# Patient Record
Sex: Male | Born: 1965 | Hispanic: Yes | Marital: Single | Smoking: Former smoker
Health system: Southern US, Community
[De-identification: ages and names within clinical notes are randomized; demographics above are authoritative.]

## PROBLEM LIST (undated history)

## (undated) DIAGNOSIS — E785 Hyperlipidemia, unspecified: Secondary | ICD-10-CM

## (undated) HISTORY — DX: Hyperlipidemia, unspecified: E78.5

---

## 2015-08-30 ENCOUNTER — Ambulatory Visit: Payer: Self-pay | Admitting: Physician Assistant

## 2015-08-31 ENCOUNTER — Other Ambulatory Visit: Payer: Self-pay | Admitting: Physician Assistant

## 2015-08-31 LAB — LIPID PANEL
CHOLESTEROL: 152 mg/dL (ref 125–200)
HDL: 45 mg/dL (ref >=40)
LDL CALC: 92 mg/dL (ref <130)
Total CHOL/HDL Ratio: 3.4 Ratio (ref <=5.0)
Triglycerides: 76 mg/dL (ref <150)
VLDL: 15 mg/dL (ref <30)

## 2015-08-31 LAB — COMPREHENSIVE METABOLIC PANEL
ALBUMIN: 4.2 g/dL (ref 3.6–5.1)
ALT: 38 U/L (ref 9–46)
AST: 26 U/L (ref 10–40)
Alkaline Phosphatase: 77 U/L (ref 40–115)
BILIRUBIN TOTAL: 0.7 mg/dL (ref 0.2–1.2)
BUN: 16 mg/dL (ref 7–25)
CHLORIDE: 108 mmol/L (ref 98–110)
CO2: 23 mmol/L (ref 20–31)
CREATININE: 0.84 mg/dL (ref 0.60–1.35)
Calcium: 9.3 mg/dL (ref 8.6–10.3)
Glucose, Bld: 100 mg/dL — ABNORMAL HIGH (ref 65–99)
Potassium: 5 mmol/L (ref 3.5–5.3)
SODIUM: 140 mmol/L (ref 135–146)
TOTAL PROTEIN: 6.6 g/dL (ref 6.1–8.1)

## 2015-09-05 ENCOUNTER — Ambulatory Visit: Payer: Self-pay | Admitting: Physician Assistant

## 2015-09-05 ENCOUNTER — Encounter: Payer: Self-pay | Admitting: Physician Assistant

## 2015-09-05 VITALS — BP 118/74 | HR 59 | Temp 97.7°F | Ht 61.5 in | Wt 143.4 lb

## 2015-09-05 DIAGNOSIS — E785 Hyperlipidemia, unspecified: Secondary | ICD-10-CM

## 2015-09-05 NOTE — Progress Notes (Signed)
BP 118/74 mmHg  Pulse 59  Temp(Src) 97.7 F (36.5 C)  Ht 5' 1.5" (1.562 m)  Wt 143 lb 6.4 oz (65.046 kg)  BMI 26.66 kg/m2  SpO2 98%   Subjective:    Patient ID: Eric Warner, male    DOB: Dec 18, 1965, 50 y.o.   MRN: [id number]  HPI: Eric Warner is a 49 y.o. male presenting on 09/05/2015 for Hyperlipidemia   HPI Pt is no longer taking his lovastatin.  He just stopped taking it because he thought he would be okay without it.  He discontinued it about 2 months ago.  He has been eating low-fat diet.  Relevant past medical, surgical, family and social history reviewed and updated as indicated. Interim medical history since our last visit reviewed. Allergies and medications reviewed and updated.  CURRENT MEDS: none  Review of Systems  Constitutional: Negative for fever, chills, diaphoresis, appetite change, fatigue and unexpected weight change.  HENT: Negative for congestion, dental problem, drooling, ear pain, facial swelling, hearing loss, mouth sores, sneezing, sore throat, trouble swallowing and voice change.   Eyes: Negative for pain, discharge, redness, itching and visual disturbance.  Respiratory: Negative for cough, choking, shortness of breath and wheezing.   Cardiovascular: Negative for chest pain, palpitations and leg swelling.  Gastrointestinal: Negative for vomiting, abdominal pain, diarrhea, constipation and blood in stool.  Endocrine: Negative for cold intolerance, heat intolerance and polydipsia.  Genitourinary: Negative for dysuria, hematuria and decreased urine volume.  Musculoskeletal: Negative for back pain, arthralgias and gait problem.  Skin: Negative for rash.  Allergic/Immunologic: Negative for environmental allergies.  Neurological: Negative for seizures, syncope, light-headedness and headaches.  Hematological: Negative for adenopathy.  Psychiatric/Behavioral: Negative for suicidal ideas, dysphoric mood and agitation. The patient is not  nervous/anxious.     Per HPI unless specifically indicated above     Objective:    BP 118/74 mmHg  Pulse 59  Temp(Src) 97.7 F (36.5 C)  Ht 5' 1.5" (1.562 m)  Wt 143 lb 6.4 oz (65.046 kg)  BMI 26.66 kg/m2  SpO2 98%  Wt Readings from Last 3 Encounters:  09/05/15 143 lb 6.4 oz (65.046 kg)    Physical Exam  Constitutional: He is oriented to person, place, and time. He appears well-developed and well-nourished.  HENT:  Head: Normocephalic and atraumatic.  Neck: Neck supple.  Cardiovascular: Normal rate and regular rhythm.   Pulmonary/Chest: Effort normal and breath sounds normal. He has no wheezes.  Abdominal: Soft. Bowel sounds are normal. There is no tenderness.  Musculoskeletal: He exhibits no edema.  Lymphadenopathy:    He has no cervical adenopathy.  Neurological: He is alert and oriented to person, place, and time.  Skin: Skin is warm and dry.  Psychiatric: He has a normal mood and affect. His behavior is normal.  Vitals reviewed.   Results for orders placed or performed in visit on 08/31/15  Comprehensive metabolic panel  Result Value Ref Range   Sodium 140 135 - 146 mmol/L   Potassium 5.0 3.5 - 5.3 mmol/L   Chloride 108 98 - 110 mmol/L   CO2 23 20 - 31 mmol/L   Glucose, Bld 100 (H) 65 - 99 mg/dL   BUN 16 7 - 25 mg/dL   Creat 4.09 8.11 - 9.14 mg/dL   Total Bilirubin 0.7 0.2 - 1.2 mg/dL   Alkaline Phosphatase 77 40 - 115 U/L   AST 26 10 - 40 U/L   ALT 38 9 - 46 U/L   Total Protein 6.6 6.1 -  8.1 g/dL   Albumin 4.2 3.6 - 5.1 g/dL   Calcium 9.3 8.6 - 16.110.3 mg/dL  Lipid panel  Result Value Ref Range   Cholesterol 152 125 - 200 mg/dL   Triglycerides 76 <096<150 mg/dL   HDL 45 >=04>=40 mg/dL   Total CHOL/HDL Ratio 3.4 <=5.0 Ratio   VLDL 15 <30 mg/dL   LDL Cholesterol 92 <540<130 mg/dL      Assessment & Plan:   Encounter Diagnosis  Name Primary?  . Hyperlipidemia Yes   Pt will continue to eat lowfat diet to maintain his cholesterol at a healthy level.  F/u 1 year.   RTO sooner prn

## 2016-05-02 ENCOUNTER — Ambulatory Visit: Payer: Self-pay | Admitting: Physician Assistant

## 2016-05-02 ENCOUNTER — Encounter: Payer: Self-pay | Admitting: Physician Assistant

## 2016-05-02 VITALS — BP 118/84 | HR 64 | Temp 97.7°F | Ht 61.5 in | Wt 144.5 lb

## 2016-05-02 DIAGNOSIS — K649 Unspecified hemorrhoids: Secondary | ICD-10-CM

## 2016-05-02 MED ORDER — HYDROCORTISONE ACETATE 25 MG RE SUPP: RECTAL | 3 refills | Status: DC

## 2016-05-02 NOTE — Patient Instructions (Signed)
Hemorroides   (Hemorrhoids)  Las hemorroides son venas inflamadas alrededor del recto o del ano. Hay dos tipos de hemorroides:   · Hemorroides internas. Se forman en las venas del interior del recto. Pueden abultarse hacia el exterior e irritarse y doler.  · Hemorroides externas. Se producen en las venas externas al ano y pueden sentirse como un bulto o zona hinchada dura y dolorosa cerca del ano.  CAUSAS   · Embarazo.    · Obesidad.    · Constipación o diarrea.    · Dificultad para mover el intestino.    · Permanecer sentado durante largos períodos en el inodoro.  · Levantar objetos pesados u otras actividades que impliquen esfuerzo.  · Sexo anal.  SÍNTOMAS   · Dolor.    · Picazón o irritación anal.    · Sangrado rectal.    · Pérdida fecal.    · Inflamación anal.    · Uno o más bultos en la zona anal.    DIAGNÓSTICO   El médico puede diagnosticar las hemorroides mediante un examen visual. Otros estudios o análisis que se pueden realizar son:   · Examen de la zona rectal con una mano enguantada (examen digital rectal).    · Examen del canal anal utilizando un pequeño tubo (endoscopio).    · Análisis de sangre si ha perdido una cantidad significativa de sangre.  · Un estudio para observar el interior del colon (sigmoideoscopía o colonoscopía).  TRATAMIENTO   La mayoría de las hemorroides pueden tratarse en casa. Sin embargo, si los síntomas no mejoran o tiene sangrado rectal, el médico puede realizar un procedimiento para disminuir las hemorroides o extirparlas completamente. Los tratamientos posibles son:   · Colocación de una banda de goma en la base de la hemorroide para cortar la circulación (ligadura con banda elástica).    · Inyección de una sustancia química para achicar las hemorroides (escleroterapia).    · Utilización de un instrumento para quemar las hemorroides (terapia con luz infrarroja).    · Extirpación quirúrgica de la hemorroides (hemorroidectomía).    · Colocación de grapas en la hemorroides para  bloquear el flujo de sangre a los tejidos (engrapado de hemorroides).    INSTRUCCIONES PARA EL CUIDADO EN EL HOGAR   · Consuma alimentos con fibra, como cereales integrales, legumbres, frutos secos, frutas y verduras. Pregúntele a su médico acerca de tomar productos con fibra añadida en ellos (suplementos de fibra).  · Aumente la ingestión de líquidos. Beba gran cantidad de líquido para mantener la orina de tono claro o color amarillo pálido.    · Haga ejercicios regularmente.    · Vaya al baño cuando sienta la necesidad de mover el intestino. No espere.    · Evite hacer fuerza al mover el intestino.    · Mantenga la zona anal limpia y seca. Use papel higiénico húmedo o toallitas humedecidas después de mover el intestino.    · Puede usar o aplicar según las indicaciones algunas cremas especiales y supositorios.    · Tome sólo medicamentos de venta libre o recetados, según las indicaciones del médico.    · Tome baños de asiento tibios durante 15-20 minutos, 3-4 veces por día para calmar el dolor y las molestias.    · Coloque una bolsa de hielo sobre las hemorroides si le duelen o se hinchan. Usar compresas de hielo entre los baños de asiento puede ser efectivo.    ¨ Ponga el hielo en una bolsa plástica.    ¨ Colóquese una toalla entre la piel y la bolsa de hielo.    ¨ Deje el hielo durante 15 - 20 minutos y aplíquelo 3 - 4   veces por día.    · No utilice una almohada en forma de aro ni se siente en el inodoro durante períodos prolongados. Esto aumenta la afluencia de sangre y el dolor.    SOLICITE ATENCIÓN MÉDICA SI:   · Aumenta el dolor y la hinchazón y no puede controlarlo con la medicación o con un tratamiento.  · Tiene un sangrado que no puede controlar.  · No puede mover el intestino.  · Siente dolor o tiene inflamación fuera de la zona de las hemorroides.  ASEGÚRESE DE QUE:   · Comprende estas instrucciones.  · Controlará su enfermedad.  · Recibirá ayuda de inmediato si no mejora o si empeora.     Esta  información no tiene como fin reemplazar el consejo del médico. Asegúrese de hacerle al médico cualquier pregunta que tenga.     Document Released: 08/26/2005 Document Revised: 04/28/2013  Elsevier Interactive Patient Education ©2016 [employer]

## 2016-05-02 NOTE — Progress Notes (Signed)
   BP 118/84 (BP Location: Left Arm, Patient Position: Sitting, Cuff Size: Normal)   Pulse 64   Temp 97.7 F (36.5 C)   Ht 5' 1.5" (1.562 m)   Wt 144 lb 8 oz (65.5 kg)   SpO2 98%   BMI 26.86 kg/m    Subjective:    Patient ID: Eric Warner, male    DOB: 1966-09-07, 50 y.o.   MRN: [id number]  HPI: Eric Warner is a 50 y.o. male presenting on 05/02/2016 for Rectal Pain (pain began 2 weeks ago and went to urgent care and was given hydrocortisione cream 2.5% pt states it was helpful. pt states on Monday 04-29-16 pain worsen. pt states no blood in stool. )   HPI  Relevant past medical, surgical, family and social history reviewed and updated as indicated. Interim medical history since our last visit reviewed. Allergies and medications reviewed and updated.  CURRENT MEDS: none  Review of Systems  Constitutional: Negative for appetite change, chills, diaphoresis, fatigue, fever and unexpected weight change.  HENT: Negative for congestion, drooling, ear pain, facial swelling, hearing loss, mouth sores, sneezing, sore throat, trouble swallowing and voice change.   Eyes: Negative for pain, discharge, redness, itching and visual disturbance.  Respiratory: Negative for cough, choking, shortness of breath and wheezing.   Cardiovascular: Negative for chest pain, palpitations and leg swelling.  Gastrointestinal: Negative for abdominal pain, blood in stool, constipation, diarrhea and vomiting.  Endocrine: Negative for cold intolerance, heat intolerance and polydipsia.  Genitourinary: Negative for decreased urine volume, dysuria and hematuria.  Musculoskeletal: Negative for arthralgias, back pain and gait problem.  Skin: Negative for rash.  Allergic/Immunologic: Negative for environmental allergies.  Neurological: Negative for seizures, syncope, light-headedness and headaches.  Hematological: Negative for adenopathy.  Psychiatric/Behavioral: Negative for agitation, dysphoric mood  and suicidal ideas. The patient is not nervous/anxious.     Per HPI unless specifically indicated above     Objective:    BP 118/84 (BP Location: Left Arm, Patient Position: Sitting, Cuff Size: Normal)   Pulse 64   Temp 97.7 F (36.5 C)   Ht 5' 1.5" (1.562 m)   Wt 144 lb 8 oz (65.5 kg)   SpO2 98%   BMI 26.86 kg/m   Wt Readings from Last 3 Encounters:  05/02/16 144 lb 8 oz (65.5 kg)  09/05/15 143 lb 6.4 oz (65 kg)    Physical Exam  Constitutional: He is oriented to person, place, and time. He appears well-developed and well-nourished.  HENT:  Head: Normocephalic and atraumatic.  Pulmonary/Chest: Effort normal.  Abdominal: Soft. He exhibits no distension and no mass. There is no tenderness. There is no rebound and no guarding.  Genitourinary: Rectal exam shows external hemorrhoid (very mild, nonthrombosed).  Genitourinary Comments: (nurse Berenice assisted)  Neurological: He is alert and oriented to person, place, and time.  Skin: Skin is warm and dry.  Psychiatric: He has a normal mood and affect. His behavior is normal.  Nursing note and vitals reviewed.       Assessment & Plan:   Encounter Diagnosis  Name Primary?  . Hemorrhoids, unspecified hemorrhoid type Yes    -rx anusol supp, fiber/metamucil, fluids -Gave reading information -f/u as scheduled.  RTO sooner prn

## 2016-08-06 ENCOUNTER — Ambulatory Visit: Payer: Self-pay | Admitting: Physician Assistant

## 2016-08-19 ENCOUNTER — Ambulatory Visit: Payer: Self-pay | Admitting: Physician Assistant

## 2016-08-19 ENCOUNTER — Encounter: Payer: Self-pay | Admitting: Physician Assistant

## 2016-08-19 VITALS — BP 120/62 | HR 61 | Temp 97.7°F | Ht 61.5 in | Wt 147.0 lb

## 2016-08-19 DIAGNOSIS — E785 Hyperlipidemia, unspecified: Secondary | ICD-10-CM

## 2016-08-19 DIAGNOSIS — M25512 Pain in left shoulder: Secondary | ICD-10-CM

## 2016-08-19 NOTE — Progress Notes (Signed)
BP 120/62 (BP Location: Left Arm, Patient Position: Sitting, Cuff Size: Normal)   Pulse 61   Temp 97.7 F (36.5 C)   Ht 5' 1.5" (1.562 m)   Wt 147 lb (66.7 kg)   SpO2 99%   BMI 27.33 kg/m    Subjective:    Patient ID: Eric Warner, male    DOB: 1965-12-08, 50 y.o.   MRN: [id number]  HPI: Eric Warner is a 50 y.o. male presenting on 08/19/2016 for Hyperlipidemia   HPI  Pt has not been eating lowfat diet.  Pt states L shoulder pain at night when he is laying on it.  Pt works [employer].  He says he isn't having any problems with work but it hurts at night when he lays down on it.  L shoulder. pain. for about 1.5 months. pt has used icy hot and it is a little helpful sometimes.   Relevant past medical, surgical, family and social history reviewed and updated as indicated. Interim medical history since our last visit reviewed. Allergies and medications reviewed and updated.  No current outpatient prescriptions on file.   Review of Systems  Constitutional: Negative for appetite change, chills, diaphoresis, fatigue, fever and unexpected weight change.  HENT: Negative for congestion, dental problem, drooling, ear pain, facial swelling, hearing loss, mouth sores, sneezing, sore throat, trouble swallowing and voice change.   Eyes: Negative for pain, discharge, redness, itching and visual disturbance.  Respiratory: Negative for cough, choking, shortness of breath and wheezing.   Cardiovascular: Negative for chest pain, palpitations and leg swelling.  Gastrointestinal: Negative for abdominal pain, blood in stool, constipation, diarrhea and vomiting.  Endocrine: Negative for cold intolerance, heat intolerance and polydipsia.  Genitourinary: Negative for decreased urine volume, dysuria and hematuria.  Musculoskeletal: Negative for arthralgias, back pain and gait problem.  Skin: Negative for rash.  Allergic/Immunologic: Negative for environmental allergies.   Neurological: Negative for seizures, syncope, light-headedness and headaches.  Hematological: Negative for adenopathy.  Psychiatric/Behavioral: Negative for agitation, dysphoric mood and suicidal ideas. The patient is not nervous/anxious.     Per HPI unless specifically indicated above     Objective:    BP 120/62 (BP Location: Left Arm, Patient Position: Sitting, Cuff Size: Normal)   Pulse 61   Temp 97.7 F (36.5 C)   Ht 5' 1.5" (1.562 m)   Wt 147 lb (66.7 kg)   SpO2 99%   BMI 27.33 kg/m   Wt Readings from Last 3 Encounters:  08/19/16 147 lb (66.7 kg)  05/02/16 144 lb 8 oz (65.5 kg)  09/05/15 143 lb 6.4 oz (65 kg)    Physical Exam  Constitutional: He is oriented to person, place, and time. He appears well-developed and well-nourished.  HENT:  Head: Normocephalic and atraumatic.  Neck: Neck supple.  Cardiovascular: Normal rate and regular rhythm.   Pulmonary/Chest: Effort normal and breath sounds normal. He has no wheezes.  Abdominal: Soft. Bowel sounds are normal. There is no hepatosplenomegaly. There is no tenderness.  Musculoskeletal: He exhibits no edema.       Left shoulder: Normal. He exhibits normal range of motion, no tenderness, no bony tenderness, no swelling, no deformity, no spasm and normal strength.  Lymphadenopathy:    He has no cervical adenopathy.  Neurological: He is alert and oriented to person, place, and time.  Skin: Skin is warm and dry.  Psychiatric: He has a normal mood and affect. His behavior is normal.  Vitals reviewed.       Assessment & Plan:  Encounter Diagnoses  Name Primary?  . Hyperlipidemia, unspecified hyperlipidemia type Yes  . Left shoulder pain, unspecified chronicity      -Check fasting lipids today. Will call with results -Counseled pt to get back to following lowfat diet -Recommended pt use ice on the shoulder for 10-15 minutes each day when he gets home from work.  He can use IBU or APAP prn.  Encouraged him to avoid  sleeping on his left side  -follow up one year.  RTO sooner prn

## 2016-08-20 LAB — LIPID PANEL
CHOL/HDL RATIO: 3.7 ratio (ref <5.0)
CHOLESTEROL: 196 mg/dL (ref <200)
HDL: 53 mg/dL (ref >40)
LDL Cholesterol: 108 mg/dL — ABNORMAL HIGH (ref <100)
Triglycerides: 176 mg/dL — ABNORMAL HIGH (ref <150)
VLDL: 35 mg/dL — ABNORMAL HIGH (ref <30)

## 2017-08-18 ENCOUNTER — Ambulatory Visit: Payer: Self-pay | Admitting: Physician Assistant

## 2017-08-19 ENCOUNTER — Encounter: Payer: Self-pay | Admitting: Physician Assistant

## 2017-08-19 ENCOUNTER — Ambulatory Visit: Payer: Self-pay | Admitting: Physician Assistant

## 2017-08-19 VITALS — BP 132/83 | HR 70 | Temp 97.5°F | Ht 61.5 in | Wt 154.5 lb

## 2017-08-19 DIAGNOSIS — E785 Hyperlipidemia, unspecified: Secondary | ICD-10-CM

## 2017-08-19 DIAGNOSIS — R030 Elevated blood-pressure reading, without diagnosis of hypertension: Secondary | ICD-10-CM

## 2017-08-19 NOTE — Progress Notes (Signed)
   BP 137/88 (BP Location: Left Arm, Patient Position: Sitting, Cuff Size: Normal)   Pulse 70   Temp (!) 97.5 F (36.4 C)   Ht 5' 1.5" (1.562 m)   Wt 154 lb 8 oz (70.1 kg)   SpO2 98%   BMI 28.72 kg/m    Subjective:    Patient ID: Eric Warner, male    DOB: May 11, 1966, 51 y.o.   MRN: [id number]  HPI: Eric Warner is a 51 y.o. male presenting on 08/19/2017 for Hyperlipidemia   HPI  Pt is eating low fat and exercising regularly.   He feels well  Relevant past medical, surgical, family and social history reviewed and updated as indicated. Interim medical history since our last visit reviewed. Allergies and medications reviewed and updated.  No current outpatient medications on file.   Review of Systems  Constitutional: Negative for appetite change, chills, diaphoresis, fatigue, fever and unexpected weight change.  HENT: Negative for congestion, dental problem, drooling, ear pain, facial swelling, hearing loss, mouth sores, sneezing, sore throat, trouble swallowing and voice change.   Eyes: Negative for pain, discharge, redness, itching and visual disturbance.  Respiratory: Negative for cough, choking, shortness of breath and wheezing.   Cardiovascular: Negative for chest pain, palpitations and leg swelling.  Gastrointestinal: Negative for abdominal pain, blood in stool, constipation, diarrhea and vomiting.  Endocrine: Negative for cold intolerance, heat intolerance and polydipsia.  Genitourinary: Negative for decreased urine volume, dysuria and hematuria.  Musculoskeletal: Negative for arthralgias, back pain and gait problem.  Skin: Negative for rash.  Allergic/Immunologic: Negative for environmental allergies.  Neurological: Negative for seizures, syncope, light-headedness and headaches.  Hematological: Negative for adenopathy.  Psychiatric/Behavioral: Negative for agitation, dysphoric mood and suicidal ideas. The patient is not nervous/anxious.     Per HPI  unless specifically indicated above     Objective:    BP 137/88 (BP Location: Left Arm, Patient Position: Sitting, Cuff Size: Normal)   Pulse 70   Temp (!) 97.5 F (36.4 C)   Ht 5' 1.5" (1.562 m)   Wt 154 lb 8 oz (70.1 kg)   SpO2 98%   BMI 28.72 kg/m   Wt Readings from Last 3 Encounters:  08/19/17 154 lb 8 oz (70.1 kg)  08/19/16 147 lb (66.7 kg)  05/02/16 144 lb 8 oz (65.5 kg)    Physical Exam  Constitutional: He is oriented to person, place, and time. He appears well-developed and well-nourished.  HENT:  Head: Normocephalic and atraumatic.  Neck: Neck supple.  Cardiovascular: Normal rate and regular rhythm.  Pulmonary/Chest: Effort normal and breath sounds normal. He has no wheezes.  Abdominal: Soft. Bowel sounds are normal. There is no hepatosplenomegaly. There is no tenderness.  Musculoskeletal: He exhibits no edema.  Lymphadenopathy:    He has no cervical adenopathy.  Neurological: He is alert and oriented to person, place, and time.  Skin: Skin is warm and dry.  Psychiatric: He has a normal mood and affect. His behavior is normal.  Vitals reviewed.        Assessment & Plan:    Encounter Diagnoses  Name Primary?  . Hyperlipidemia, unspecified hyperlipidemia type Yes  . Elevated blood pressure reading in office without diagnosis of hypertension      -Check fasting labs.  Will call pt with results -pt to  Continue lowfat diet and regular exercise -pt to Follow up 6 month to recheck mildly elevated blood pressure.  RTO sooner prn

## 2017-08-20 ENCOUNTER — Ambulatory Visit: Payer: Self-pay | Admitting: Physician Assistant

## 2017-08-28 DIAGNOSIS — Z139 Encounter for screening, unspecified: Secondary | ICD-10-CM

## 2017-08-28 NOTE — Congregational Nurse Program (Signed)
Congregational Nurse Program Note  Date of Encounter: 08/28/2017  Past Medical History: Past Medical History:  Diagnosis Date  . Hyperlipidemia     Encounter Details: CNP Questionnaire - 08/28/17 1520      Questionnaire   Patient Status  Immigrant    Race  Hispanic or Latino    [facility]    Insurance  Not Applicable    Uninsured  Uninsured (NEW 1x/quarter)    Food  No food insecurities    Housing/Utilities  Yes, have permanent housing    Transportation  No transportation needs    Interpersonal Safety  Yes, feel physically and emotionally safe where you currently live    Medication  No medication insecurities    Medical Provider  Yes    Referrals  Not Applicable    ED Visit Averted  Not Applicable    Life-Saving Intervention Made  Not Applicable       51 y.o hispanic male pt [facility]. [facility]   Vitals: BP: 122/81 Pulse: 64 Temp: 98.2 oral Ht: 5'2 Wt: 152.0  Per pt: Family History:  Mother- diabetes    Pt was given her Flu vaccine on left deltoid.   Flu vaccine info:  Lot # [id number] NDC: [id number] MFG: (IIV) [employer] Fluarix 2018-19 Expires: 03/08/2018  Pt was asked to stay 15 minuetes after her flu vaccine was given. Pt was not expressing any adverse reaction, no allergic reaction was noted while the pt was in the office.Pt was advised to go to the nearest emergency room or call 911 if he had any SOB, rash, or swelling. Pt was also advised to take ibuprofen or tylenol if he can and needs it for the soreness. Also apply a cold compress if needed. Pt agreed.      Kainan Patty R. ClawsonRangel, [place]LP[place] [phone]

## 2018-02-09 ENCOUNTER — Ambulatory Visit: Payer: Self-pay | Admitting: Physician Assistant

## 2018-02-09 ENCOUNTER — Other Ambulatory Visit (HOSPITAL_COMMUNITY)
Admission: RE | Admit: 2018-02-09 | Discharge: 2018-02-09 | Disposition: A | Discharge status: Home / Self Care | Payer: Self-pay | Source: Ambulatory Visit | Attending: Physician Assistant | Admitting: Physician Assistant

## 2018-02-09 DIAGNOSIS — E785 Hyperlipidemia, unspecified: Secondary | ICD-10-CM | POA: Insufficient documentation

## 2018-02-09 DIAGNOSIS — R030 Elevated blood-pressure reading, without diagnosis of hypertension: Secondary | ICD-10-CM | POA: Insufficient documentation

## 2018-02-09 LAB — COMPREHENSIVE METABOLIC PANEL
ALK PHOS: 71 U/L (ref 38–126)
ALT: 32 U/L (ref 17–63)
AST: 24 U/L (ref 15–41)
Albumin: 4 g/dL (ref 3.5–5.0)
Anion gap: 4 — ABNORMAL LOW (ref 5–15)
BILIRUBIN TOTAL: 0.9 mg/dL (ref 0.3–1.2)
BUN: 13 mg/dL (ref 6–20)
CALCIUM: 9.3 mg/dL (ref 8.9–10.3)
CO2: 27 mmol/L (ref 22–32)
CREATININE: 0.82 mg/dL (ref 0.61–1.24)
Chloride: 109 mmol/L (ref 101–111)
Glucose, Bld: 114 mg/dL — ABNORMAL HIGH (ref 65–99)
Potassium: 4.6 mmol/L (ref 3.5–5.1)
Sodium: 140 mmol/L (ref 135–145)
TOTAL PROTEIN: 7.1 g/dL (ref 6.5–8.1)

## 2018-02-09 LAB — LIPID PANEL
Cholesterol: 231 mg/dL — ABNORMAL HIGH (ref 0–200)
HDL: 54 mg/dL (ref >40)
LDL CALC: 150 mg/dL — AB (ref 0–99)
Total CHOL/HDL Ratio: 4.3 RATIO
Triglycerides: 135 mg/dL (ref <150)
VLDL: 27 mg/dL (ref 0–40)

## 2018-02-11 ENCOUNTER — Encounter: Payer: Self-pay | Admitting: Physician Assistant

## 2018-02-11 ENCOUNTER — Ambulatory Visit: Payer: Self-pay | Admitting: Physician Assistant

## 2018-02-11 VITALS — BP 116/76 | HR 62 | Temp 97.7°F | Wt 143.5 lb

## 2018-02-11 DIAGNOSIS — Z125 Encounter for screening for malignant neoplasm of prostate: Secondary | ICD-10-CM

## 2018-02-11 DIAGNOSIS — L659 Nonscarring hair loss, unspecified: Secondary | ICD-10-CM

## 2018-02-11 DIAGNOSIS — E785 Hyperlipidemia, unspecified: Secondary | ICD-10-CM

## 2018-02-11 NOTE — Progress Notes (Signed)
BP 116/76 (BP Location: Left Arm, Patient Position: Sitting, Cuff Size: Normal)   Pulse 62   Temp 97.7 F (36.5 C)   Wt 143 lb 8 oz (65.1 kg)   SpO2 100%   BMI 26.25 kg/m    Subjective:    Patient ID: Eric Warner, male    DOB: 06/27/1966, 52 y.o.   MRN: [id number]  HPI: Eric Warner is a 52 y.o. male presenting on 02/11/2018 for Hair/Scalp Problem (pt has place on head where hair seems to not grown anymore.)   HPI  Chief Complaint  Patient presents with  . Hair/Scalp Problem    pt has place on head where hair seems to not grown anymore.     Pt says the bald spot has been an issue for about 3 month.   Relevant past medical, surgical, family and social history reviewed and updated as indicated. Interim medical history since our last visit reviewed. Allergies and medications reviewed and updated.  No current outpatient medications on file.   Review of Systems  Constitutional: Negative for appetite change, chills, diaphoresis, fatigue, fever and unexpected weight change.  HENT: Positive for dental problem. Negative for congestion, drooling, ear pain, facial swelling, hearing loss, mouth sores, sneezing, sore throat, trouble swallowing and voice change.   Eyes: Negative for pain, discharge, redness, itching and visual disturbance.  Respiratory: Negative for cough, choking, shortness of breath and wheezing.   Cardiovascular: Negative for chest pain, palpitations and leg swelling.  Gastrointestinal: Negative for abdominal pain, blood in stool, constipation, diarrhea and vomiting.  Endocrine: Negative for cold intolerance, heat intolerance and polydipsia.  Genitourinary: Negative for decreased urine volume, dysuria and hematuria.  Musculoskeletal: Negative for arthralgias, back pain and gait problem.  Skin: Negative for rash.  Allergic/Immunologic: Negative for environmental allergies.  Neurological: Negative for seizures, syncope, light-headedness and  headaches.  Hematological: Negative for adenopathy.  Psychiatric/Behavioral: Negative for agitation, dysphoric mood and suicidal ideas. The patient is not nervous/anxious.     Per HPI unless specifically indicated above     Objective:    BP 116/76 (BP Location: Left Arm, Patient Position: Sitting, Cuff Size: Normal)   Pulse 62   Temp 97.7 F (36.5 C)   Wt 143 lb 8 oz (65.1 kg)   SpO2 100%   BMI 26.25 kg/m   Wt Readings from Last 3 Encounters:  02/11/18 143 lb 8 oz (65.1 kg)  08/28/17 152 lb (68.9 kg)  08/19/17 154 lb 8 oz (70.1 kg)    Physical Exam  Constitutional: He is oriented to person, place, and time. He appears well-developed and well-nourished.  HENT:  Head: Normocephalic and atraumatic.  Neck: Neck supple.  Cardiovascular: Normal rate and regular rhythm.  Pulmonary/Chest: Effort normal and breath sounds normal. He has no wheezes.  Abdominal: Soft. Bowel sounds are normal. There is no hepatosplenomegaly. There is no tenderness.  Musculoskeletal: He exhibits no edema.  Lymphadenopathy:    He has no cervical adenopathy.  Neurological: He is alert and oriented to person, place, and time.  Skin: Skin is warm and dry.  Quarter sized bald spot left frontal area.  Closer examination reveals that there are hairs growing in the area but they appear to be placed farther apart and are shorter- broken off versus recently growing  Psychiatric: He has a normal mood and affect. His behavior is normal.  Vitals reviewed.   Results for orders placed or performed during the hospital encounter of 02/09/18  Lipid panel  Result Value Ref Range  Cholesterol 231 (H) 0 - 200 mg/dL   Triglycerides 161 <096 mg/dL   HDL 54 >04 mg/dL   Total CHOL/HDL Ratio 4.3 RATIO   VLDL 27 0 - 40 mg/dL   LDL Cholesterol 540 (H) 0 - 99 mg/dL  Comprehensive metabolic panel  Result Value Ref Range   Sodium 140 135 - 145 mmol/L   Potassium 4.6 3.5 - 5.1 mmol/L   Chloride 109 101 - 111 mmol/L   CO2  27 22 - 32 mmol/L   Glucose, Bld 114 (H) 65 - 99 mg/dL   BUN 13 6 - 20 mg/dL   Creatinine, Ser 9.81 0.61 - 1.24 mg/dL   Calcium 9.3 8.9 - 19.1 mg/dL   Total Protein 7.1 6.5 - 8.1 g/dL   Albumin 4.0 3.5 - 5.0 g/dL   AST 24 15 - 41 U/L   ALT 32 17 - 63 U/L   Alkaline Phosphatase 71 38 - 126 U/L   Total Bilirubin 0.9 0.3 - 1.2 mg/dL   GFR calc non Af Amer >60 >60 mL/min   GFR calc Af Amer >60 >60 mL/min   Anion gap 4 (L) 5 - 15      Assessment & Plan:   Encounter Diagnoses  Name Primary?  . Hyperlipidemia, unspecified hyperlipidemia type Yes  . Alopecia   . Screening for prostate cancer     -reviewed labs with pt -counseled on lowfat diet and regular exercise -recommended pt stop using soap to wash his hair and use shampoo.  It appears to be growing so will recheck at next OV -Recheck 6 months.  RTO sooner prn

## 2018-02-11 NOTE — Patient Instructions (Addendum)
Colesterol  Cholesterol  El colesterol es una sustancia blanca, cerosa, similar a la grasa, que el cuerpo necesita en pequeñas cantidades. El hígado fabrica todo el colesterol que el cuerpo necesita. La sangre transporta el colesterol desde el hígado a través de los vasos sanguíneos. Los depósitos de colesterol (placas) podrían acumularse en las paredes de los vasos sanguíneos (arterias). Las placas provocan el estrechamiento y la rigidez de las arterias. Las placas de colesterol aumentan el riesgo de infarto de miocardio y accidente cerebrovascular.  Aunque sea muy elevado, la concentración de colesterol no puede percibirse. La única forma de saber que tiene colesterol alto es mediante un análisis de sangre. Una vez que se conocen las concentraciones de colesterol, se debe llevar un registro de los resultados de los análisis. Trabaje con el médico para mantener las concentraciones en el rango deseado.  ¿Qué significan los resultados?  · El colesterol total es una medida general de todo el colesterol en sangre.  · El colesterol LDL (lipoproteínas de baja densidad) es el colesterol “malo”. Este tipo es el que hace que se acumulen placas en las paredes de las arterias. Su concentración debe ser baja.  · El colesterol HDL (lipoproteínas de alta densidad) es el colesterol “bueno”, ya que limpia las arterias y arrastra el LDL. Su concentración debe ser alta.  · Los triglicéridos son grasas que el cuerpo puede quemar ya sea como fuente de energía o para almacenar. Las concentraciones altas están estrechamente vinculadas con las enfermedades cardíacas.  ¿Cuáles son las concentraciones de colesterol deseadas?  · El colesterol total debe estar por debajo de 200.  · Para las personas con riesgo, lo aconsejable es mantener el nivel de LDL por debajo de 100; y, para las personas con alto riesgo, es por debajo de 70.  · Se aconseja mantener un nivel de HDL es por encima de 40. Se considera  que un nivel de 60 o superior protege contra las enfermedades cardíacas.  · Los triglicéridos por debajo de 150.  ¿Cómo puedo bajar el colesterol?  Dieta  Siga el programa de alimentación que el médico le indique.  · Elija el pescado o la carne blanca de pollo y pavo, asados u horneados. Limite los cortes grasos de carne roja, los alimentos fritos y las carnes procesadas, como las salchichas y los embutidos.  · Coma gran cantidad de frutas y verduras frescas.  · Elija los cereales integrales, los frijoles, las pastas, las papas y los cereales.  · Elija aceite de oliva, aceite de maíz o aceite de canola, y solo use poca cantidad.  · No coma mantequilla, mayonesa, margarina o aceites de palmiste.  · Evite los alimentos que contengan grasas trans.  · Tome leche descremada o sin grasa y coma yogur y quesos descremados o sin grasa. Evite la leche entera, la crema, los helados, las yemas de huevo y los quesos enteros.  · Los postres sanos incluyen la torta ángel, los bocadillos de jengibre, las galletas con forma de animales, los caramelos duros, los helados de agua y el yogur helado descremado o semidescremado. Evite las masas, tortas, pasteles y galletas.    La práctica de actividad física.  · Siga el programa de ejercicios que le haya indicado el médico. Programa regular:  ? Ayuda a bajar el colesterol LDL y a aumentar el HDL.  ? Ayuda a controlar el peso.  · Haga cosas que aumenten el nivel de actividad; por ejemplo, haga los trabajos de jardinería, salga a caminar o use las   lo haya indicado el mdico. ? El mdico puede recetarle medicamentos para ayudar a Advertising account executive y reducir el riesgo de enfermedades cardacas. Por lo general, esto se hace si con la dieta y la actividad fsica no se logra disminuir el nivel de colesterol. ? Si tiene varios factores de riesgo, tal  vez tenga que tomar medicamentos, incluso si las concentraciones son normales.  Esta informacin no tiene Theme park manager el consejo del mdico. Asegrese de hacerle al mdico cualquier pregunta que tenga. Document Released: 06/05/2005 Document Revised: 11/25/2016 Document Reviewed: 02/24/2016 Elsevier Interactive Patient Education  [employer].  ____________________________________________  Alopecia Areata (Alopecia Areata) La alopecia areata es un tipo de calvicie. Si tiene [employer], el pelo del cuero cabelludo se le puede caer por zonas. En algunos casos, se puede perder todo el pelo del cuero cabelludo (alopecia totalis) o todo el vello del rostro y del cuerpo (alopecia universalis).  La alopecia areata es una enfermedad autoinmunitaria, lo que significa que el sistema de defensa del organismo (sistema inmunitario) confunde las partes normales del cuerpo con microbios y otras cosas que pueden causarle enfermedades. Cuando tiene alopecia areata, el sistema inmunitario ataca los folculos pilosos.  La alopecia areata suele comenzar durante la niez, pero puede ocurrir a Actuary. No representa un peligro para la salud, pero puede ser [employer].  CAUSAS  La causa de la alopecia areata es desconocida.  FACTORES DE RIESGO Puede correr un riesgo ms elevado de tener alopecia areata si:   Tiene antecedentes familiares de alopecia.  Tiene antecedentes familiares de otra enfermedad autoinmunitaria, incluidas diabetes tipo1 y artritis reumatoide. Blake Divine SNTOMAS Los signos de alopecia pueden incluir los siguientes:  Cada del pelo del cuero cabelludo por zonas pequeas y redondeadas que pueden tener el tamao de una [place] de dlar.  Cada de todo el pelo del cuero cabelludo.  Cada del vello de las cejas, del rostro y del que crece dentro de la nariz (vello nasal).  Cada del vello de todo el cuerpo. DIAGNSTICO  La alopecia areata puede  diagnosticarse mediante:  Examen fsico e historia clnica.  La toma de [place] de pelo para analizarla con un microscopio.  La extraccin de un pequeo trozo de piel (biopsia) para su examen con un microscopio.  Anlisis de sangre para descartar otras enfermedades autoinmunitarias. TRATAMIENTO  No hay cura para la alopecia areata, pero la enfermedad suele desaparecer con el tiempo. No perder la capacidad de que el pelo le crezca nuevamente. Algunos medicamentos pueden ayudar a que el [employer] crezca otra vez con mayor rapidez. Estos incluyen los siguientes:  Corticoides. Estos medicamentos controlan la inflamacin causada por el sistema inmunitario. Puede aplicarse este medicamento como una locin para la piel o como una inyeccin.  Minoxidil. Este es un medicamento para el crecimiento capilar que puede Contractor en las zonas donde se le ha cado el pelo.  Antralina. Este es un medicamento para una inflamacin cutnea llamada psoriasis que tambin puede contribuir a la alopecia.  Difenciprona. Este medicamento se aplica sobre la piel y puede estimular el crecimiento del pelo. INSTRUCCIONES PARA EL CUIDADO EN EL HOGAR  Aplquese pantalla solar o cbrase la cabeza cuando est al [employer].  Tome los medicamentos solamente como se lo haya indicado el mdico.  Si se le han cado las cejas, use anteojos de sol cuando est al aire libre para evitar que le entre polvillo en los ojos.  Si se le ha cado el vello que  crece dentro de la [place], pngase un pauelo sobre el rostro o aplquese un ungento en las fosas nasales. Esto impide que entre polvillo y otras sustancias irritantes.  Concurra a todas las visitas de control como se lo haya indicado el mdico. Esto es importante. SOLICITE ATENCIN MDICA SI:  Los sntomas [place].  Aparecen nuevos sntomas.  Tiene una reaccin a los medicamentos.  Tiene problemas emocionales. Esta informacin no tiene Theme park managercomo fin reemplazar el consejo  del mdico. Asegrese de hacerle al mdico cualquier pregunta que tenga. Document Released: 06/16/2013 Document Revised: 09/16/2014 Elsevier Interactive Patient Education  2017 [employer]

## 2018-02-18 ENCOUNTER — Ambulatory Visit: Payer: Self-pay | Admitting: Physician Assistant

## 2018-03-17 ENCOUNTER — Ambulatory Visit: Payer: Self-pay | Admitting: Physician Assistant

## 2018-04-10 ENCOUNTER — Emergency Department (HOSPITAL_COMMUNITY)
Admission: EM | Admit: 2018-04-10 | Discharge: 2018-04-11 | Disposition: A | Discharge status: Home / Self Care | Payer: Self-pay | Attending: Emergency Medicine | Admitting: Emergency Medicine

## 2018-04-10 ENCOUNTER — Other Ambulatory Visit: Payer: Self-pay

## 2018-04-10 ENCOUNTER — Encounter (HOSPITAL_COMMUNITY): Payer: Self-pay | Admitting: Emergency Medicine

## 2018-04-10 ENCOUNTER — Emergency Department (HOSPITAL_COMMUNITY): Payer: Self-pay

## 2018-04-10 DIAGNOSIS — Y939 Activity, unspecified: Secondary | ICD-10-CM | POA: Insufficient documentation

## 2018-04-10 DIAGNOSIS — S82831A Other fracture of upper and lower end of right fibula, initial encounter for closed fracture: Secondary | ICD-10-CM | POA: Insufficient documentation

## 2018-04-10 DIAGNOSIS — Y999 Unspecified external cause status: Secondary | ICD-10-CM | POA: Insufficient documentation

## 2018-04-10 DIAGNOSIS — Y929 Unspecified place or not applicable: Secondary | ICD-10-CM | POA: Insufficient documentation

## 2018-04-10 DIAGNOSIS — W309XXA Contact with unspecified agricultural machinery, initial encounter: Secondary | ICD-10-CM | POA: Insufficient documentation

## 2018-04-10 DIAGNOSIS — Z87891 Personal history of nicotine dependence: Secondary | ICD-10-CM | POA: Insufficient documentation

## 2018-04-10 NOTE — ED Triage Notes (Signed)
Pt c/o ankle pain since yesterday. States the track of large machinery hit his right ankle while working.

## 2018-04-11 MED ORDER — TRAMADOL HCL 50 MG PO TABS: 50.0000 mg | ORAL_TABLET | Freq: Four times a day (QID) | ORAL | 0 refills | Status: DC | PRN

## 2018-04-11 MED ORDER — TRAMADOL HCL 50 MG PO TABS: 50.0000 mg | ORAL_TABLET | Freq: Once | ORAL | Status: DC

## 2018-04-11 NOTE — ED Provider Notes (Signed)
[facility] EMERGENCY DEPARTMENT Provider Note   CSN: [id number] Arrival date & time: 04/10/18  2237     History   Chief Complaint Chief Complaint  Patient presents with  . Ankle Pain    HPI Eric Warner is a 52 y.o. male.  The history is provided by the patient.  He has a history of hyperlipidemia, and comes in having injured his right ankle yesterday.  He was hit in the lateral aspect of the ankle with a tractor-like machine.  He is complaining of pain in the lateral aspect of the ankle.  He is able to walk, but it is painful.  He denies other injury.  Past Medical History:  Diagnosis Date  . Hyperlipidemia     Patient Active Problem List   Diagnosis Date Noted  . Hyperlipidemia 09/05/2015    History reviewed. No pertinent surgical history.      Home Medications    Prior to Admission medications   Medication Sig Start Date End Date Taking? Authorizing Provider  traMADol (ULTRAM) 50 MG tablet Take 1 tablet (50 mg total) by mouth every 6 (six) hours as needed. 04/11/18   Dione Booze, MD    Family History Family History  Problem Relation Age of Onset  . Diabetes Mother     Social History Social History   Tobacco Use  . Smoking status: Former Smoker    Types: Cigarettes    Last attempt to quit: 09/09/1998    Years since quitting: 19.6  . Smokeless tobacco: Never Used  Substance Use Topics  . Alcohol use: Yes    Comment: occasional  . Drug use: No     Allergies   Patient has no known allergies.   Review of Systems Review of Systems  All other systems reviewed and are negative.    Physical Exam Updated Vital Signs BP 134/82 (BP Location: Right Arm)   Pulse 64   Temp 98.3 F (36.8 C) (Oral)   Resp 16   Ht 5' (1.524 m)   Wt 67.1 kg (148 lb)   SpO2 98%   BMI 28.90 kg/m   Physical Exam  Nursing note and vitals reviewed.  52 year old male, resting comfortably and in no acute distress. Vital signs are normal. Oxygen saturation is  98%, which is normal. Head is normocephalic and atraumatic. PERRLA, EOMI. Oropharynx is clear. Neck is nontender and supple without adenopathy or JVD. Back is nontender and there is no CVA tenderness. Lungs are clear without rales, wheezes, or rhonchi. Chest is nontender. Heart has regular rate and rhythm without murmur. Abdomen is soft, flat, nontender without masses or hepatosplenomegaly and peristalsis is normoactive. Extremities: Moderate swelling and ecchymosis over the right ankle and extending into the foot.  There is tenderness to palpation over the lateral aspect of the right ankle.  There is no instability of the ankle mortise.  Anterior drawer sign is negative.  Dorsalis pedis pulses strong, capillary refill is prompt. Skin is warm and dry without rash. Neurologic: Mental status is normal, cranial nerves are intact, there are no motor or sensory deficits.  ED Treatments / Results   Radiology Dg Ankle Complete Right  Result Date: 04/10/2018 CLINICAL DATA:  Lateral and medial right ankle pain, swelling, and bruising onset yesterday. States the track of large machinery hit his right ankle while working. Inital encounter. EXAM: RIGHT ANKLE - COMPLETE 3+ VIEW COMPARISON:  None. FINDINGS: Displaced fracture fragments about the lateral ankle, donor site likely from the distal fibula just  distal to the ankle mortise. Possible nondisplaced medial malleolar tip fracture. Ankle mortise is preserved, no mortise widening. Small joint effusion. Diffuse soft tissue edema. IMPRESSION: 1. Displaced distal fibular fracture with possible nondisplaced medial malleolar tip fracture. 2. Small joint effusion and diffuse soft tissue edema. Electronically Signed   By: Rubye OaksMelanie  Ehinger M.D.   On: 04/10/2018 23:40    Procedures .Splint Application Date/Time: 04/11/2018 1:15 AM Performed by: Dione BoozeGlick, Mattelyn Imhoff, MD Authorized by: Dione BoozeGlick, Chrissi Crow, MD   Consent:    Consent obtained:  Verbal   Consent given by:   Patient   Risks discussed:  Pain and swelling   Alternatives discussed:  Alternative treatment Pre-procedure details:    Sensation:  Normal   Skin color:  Normal Procedure details:    Laterality:  Right   Location:  Ankle   Ankle:  R ankle   Strapping: no     Cast type: CAM Walker.   Supplies used: CAM Walker. Post-procedure details:    Pain:  Unchanged   Sensation:  Normal   Skin color:  Normal   Patient tolerance of procedure:  Tolerated well, no immediate complications     Medications Ordered in ED Medications  traMADol (ULTRAM) tablet 50 mg (has no administration in time range)     Initial Impression / Assessment and Plan / ED Course  I have reviewed the triage vital signs and the nursing notes.  Pertinent imaging results that were available during my care of the patient were reviewed by me and considered in my medical decision making (see chart for details).  Fracture of the distal right fibula.  He is placed in a cam walker and given crutches to use as needed and he is referred to orthopedics for follow-up.  Advised on ice and elevation.  Advised to use acetaminophen or ibuprofen for pain, given prescription for tramadol for breakthrough pain.  Final Clinical Impressions(s) / ED Diagnoses   Final diagnoses:  Closed fracture of distal end of right fibula, unspecified fracture morphology, initial encounter    ED Discharge Orders        Ordered    traMADol (ULTRAM) 50 MG tablet  Every 6 hours PRN     04/11/18 0108       Dione BoozeGlick, Manjit Bufano, MD 04/11/18 0116

## 2018-04-11 NOTE — Discharge Instructions (Addendum)
Aplique hielo durante 20-30 minutos, cuatro veces al C.H. Robinson Worldwideda.  Mantenga su pie elevado tanto como sea posible.  Use el CAM WALKER en todo momento, excepto al lavar.  Use muletas segn sea necesario.  Tome ibuprofeno o acetaminofeno segn sea necesario para Chief Technology Officerel dolor, tome tramadol para el dolor que estos no [place].

## 2018-08-13 ENCOUNTER — Ambulatory Visit: Payer: Self-pay | Admitting: Physician Assistant

## 2018-08-18 IMAGING — DX DG ANKLE COMPLETE 3+V*R*
3 series · 3 of 3 positions shown · non-contrast
Comparison: None.

CLINICAL DATA: Lateral and medial right ankle pain, swelling, and
bruising onset yesterday. States the track of large machinery hit
his right ankle while working. Inital encounter.

EXAM:
RIGHT ANKLE - COMPLETE 3+ VIEW

[ankle ap]
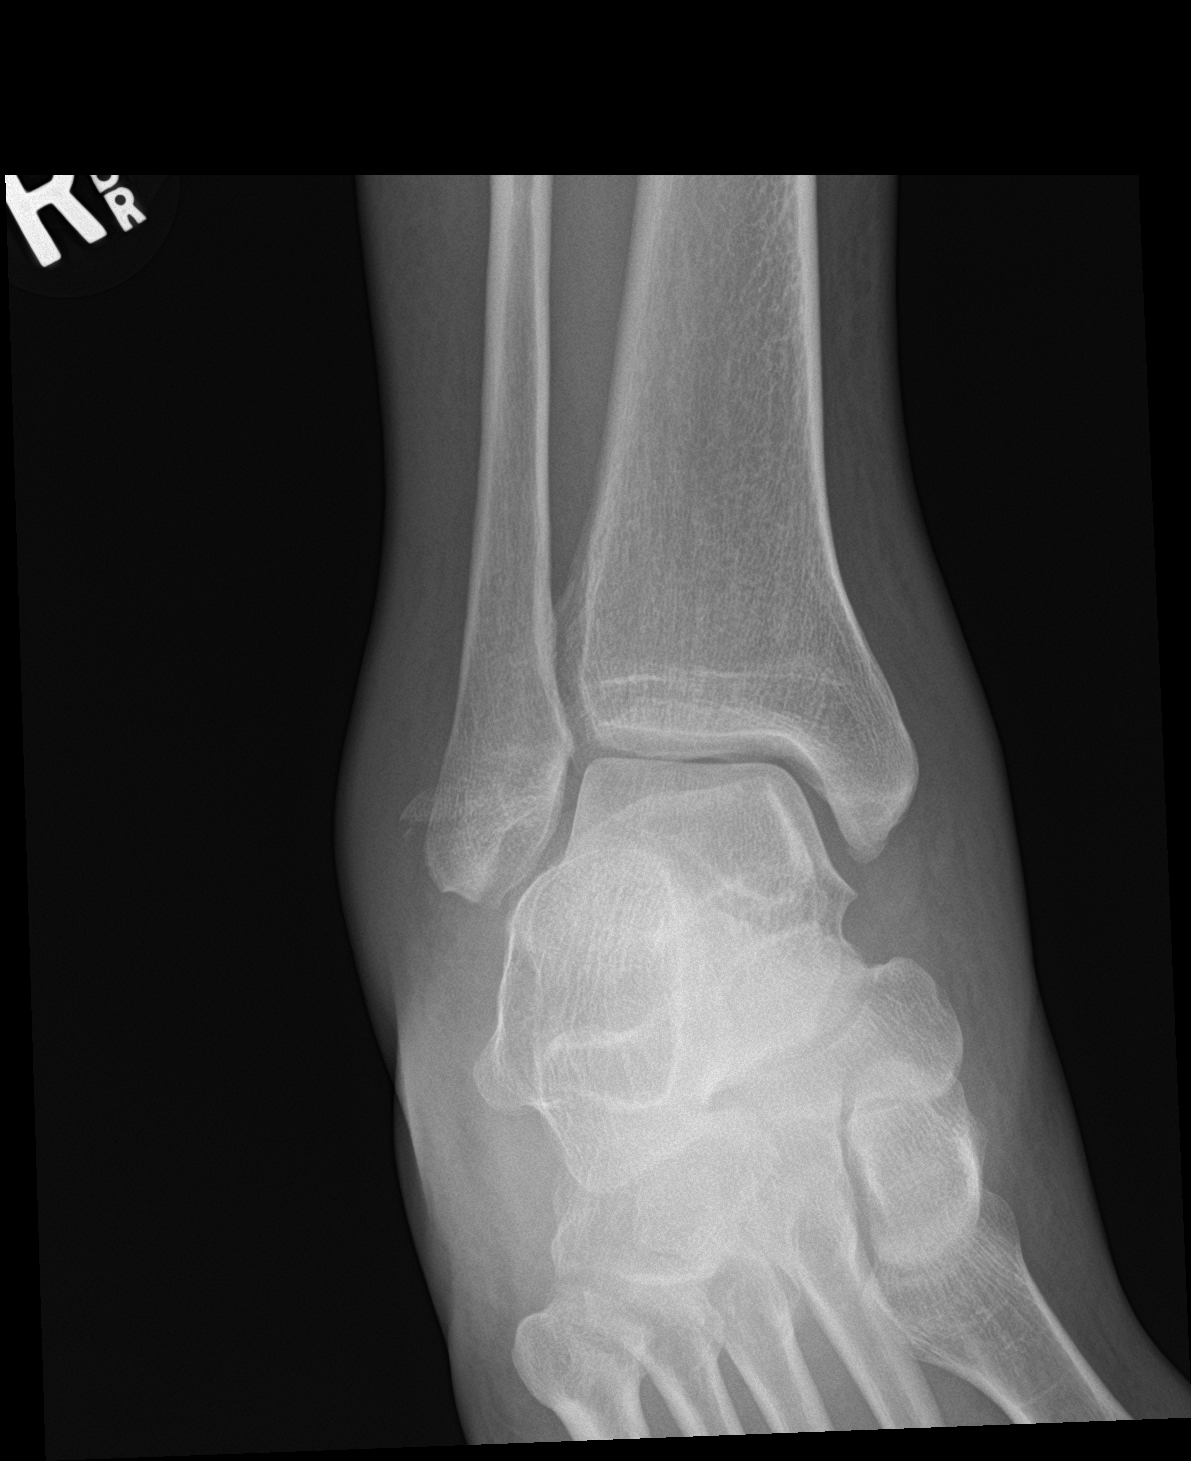

[ankle lat]
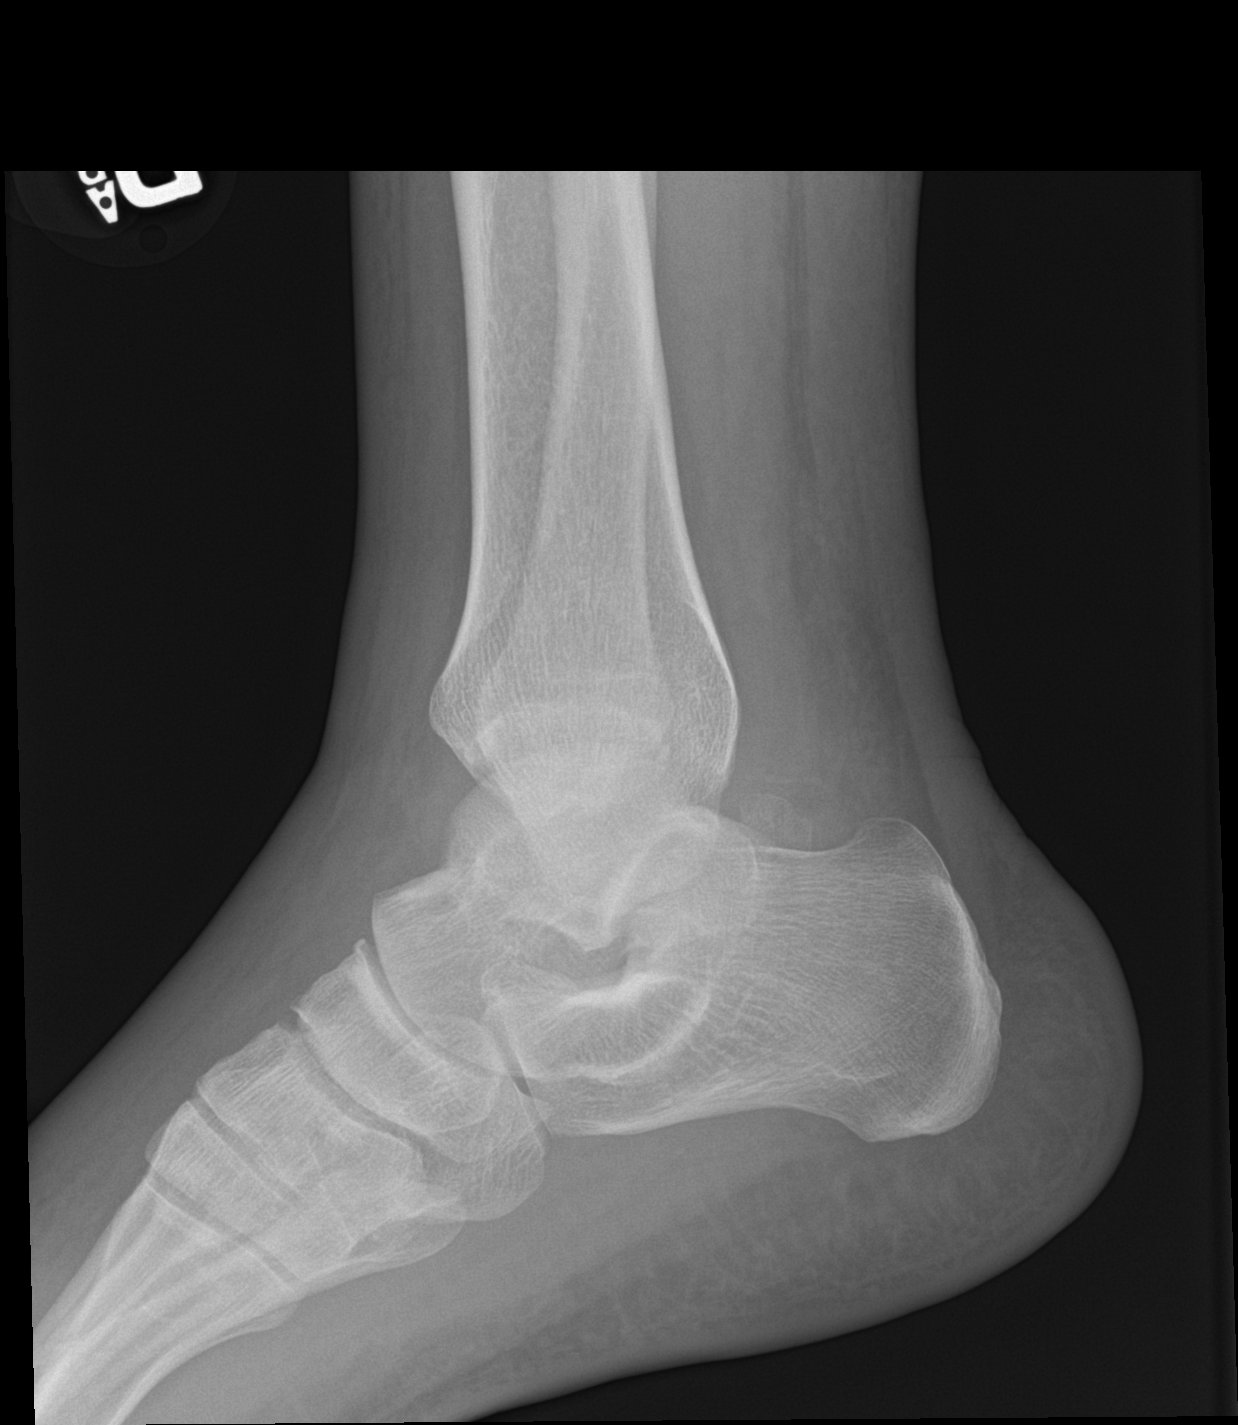

[ankle obl]
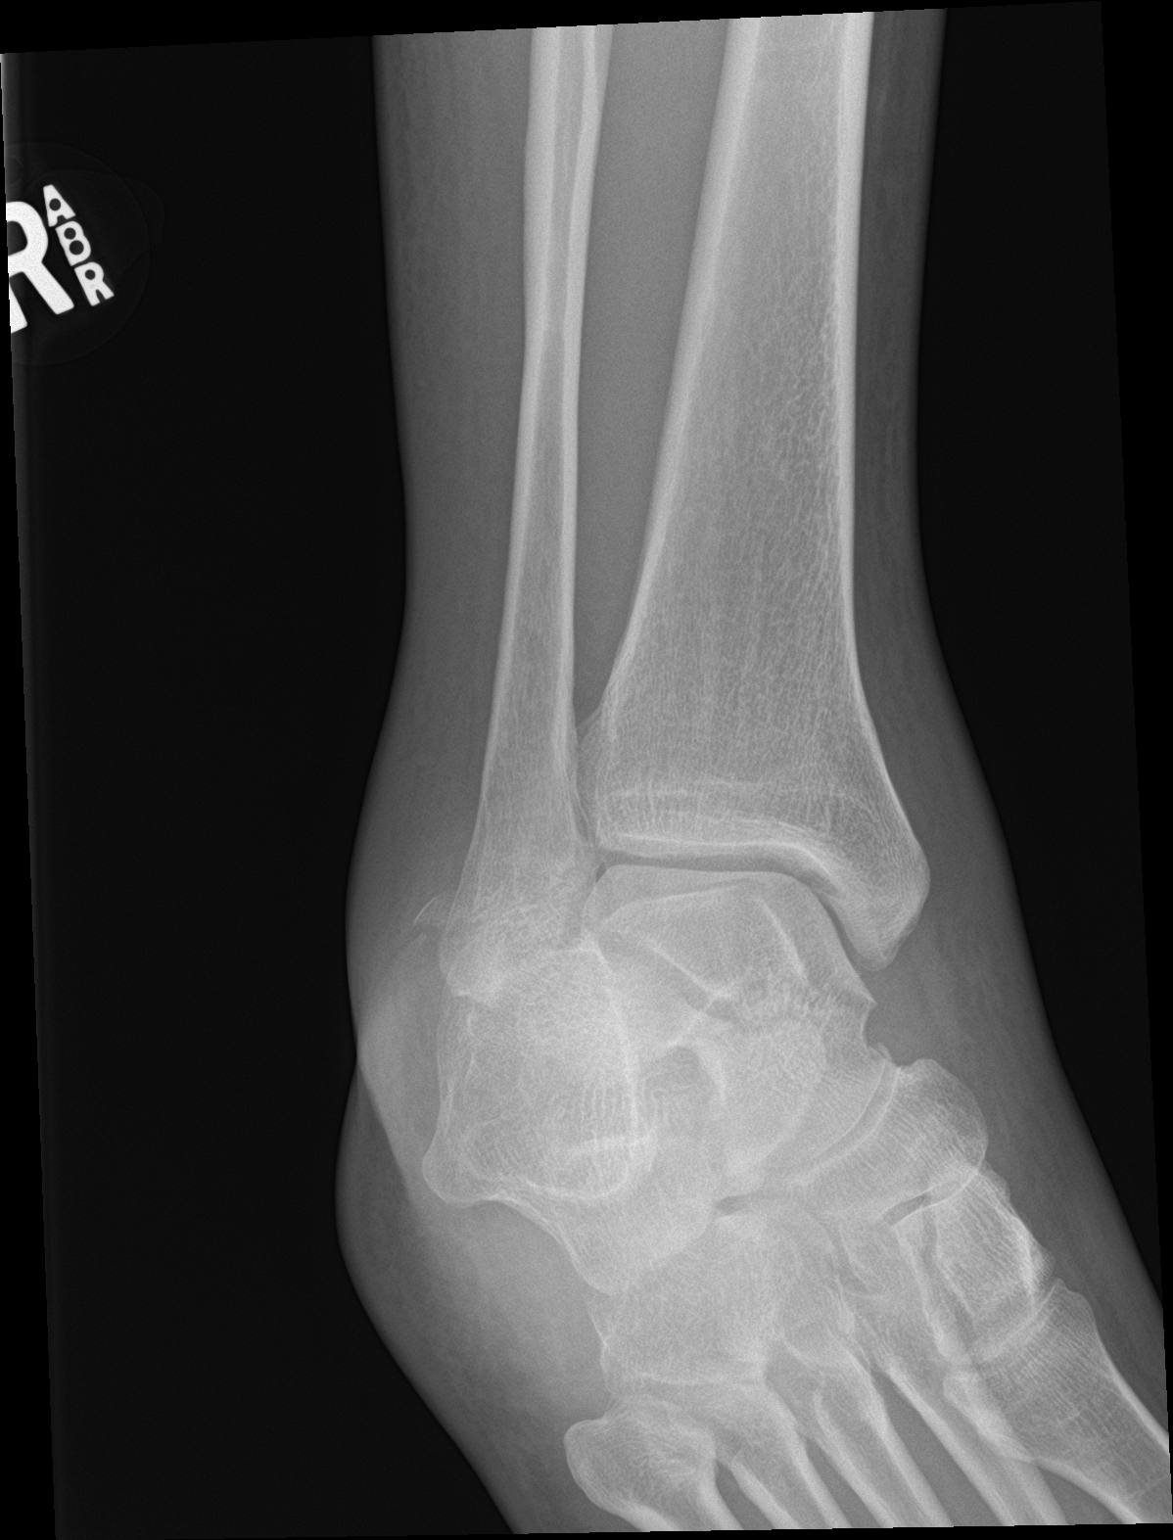

[3 of 3 positions shown; findings below may reference images not displayed]

FINDINGS: Displaced fracture fragments about the lateral ankle, donor site
likely from the distal fibula just distal to the ankle mortise.
Possible nondisplaced medial malleolar tip fracture. Ankle mortise
is preserved, no mortise widening. Small joint effusion. Diffuse
soft tissue edema.
IMPRESSION: 1. Displaced distal fibular fracture with possible nondisplaced
medial malleolar tip fracture.
2. Small joint effusion and diffuse soft tissue edema.

## 2018-09-14 ENCOUNTER — Ambulatory Visit: Payer: Self-pay | Admitting: Physician Assistant

## 2018-10-05 ENCOUNTER — Ambulatory Visit: Payer: Self-pay | Admitting: Physician Assistant

## 2019-12-09 ENCOUNTER — Telehealth: Payer: Self-pay | Admitting: Student

## 2019-12-09 NOTE — Telephone Encounter (Signed)
LPN called pt back and spoke with pt regarding pt's daughter's voice message. Pt states a metal frame fell and hit him on top part of his head on last week on Thursday, December 02, 2019 when he was working at home. Pt states at the moment of injury he felt dizzy and had a little bit of bleeding. Pt states he did not go to the ER for evaluation when the incident happened. Pt states he has since been experiencing HA, neck and back pain. Pt states he has taking tylenol which helps him be able to sleep.  Pt was last seen at [facility] 02/2018. Pt is currently not an [facility] pt until he is re-screened to restablish care with [facility]. LPN notified pt of this and advised him to contact [facility] to do this. Pt verbalized understanding.  LPN advised pt that he should have seeked ER treatment when the incident occurred. Pt verbalized understanding. LPN advises pt to go to ER for evaluation of head injury. Pt verbalized understanding.

## 2021-02-26 ENCOUNTER — Encounter: Payer: Self-pay | Admitting: Physician Assistant

## 2021-02-26 ENCOUNTER — Other Ambulatory Visit: Payer: Self-pay | Admitting: Physician Assistant

## 2021-02-26 ENCOUNTER — Other Ambulatory Visit (HOSPITAL_COMMUNITY)
Admission: RE | Admit: 2021-02-26 | Discharge: 2021-02-26 | Disposition: A | Discharge status: Home / Self Care | Payer: Self-pay | Source: Ambulatory Visit | Attending: Physician Assistant | Admitting: Physician Assistant

## 2021-02-26 ENCOUNTER — Ambulatory Visit: Payer: Self-pay | Admitting: Physician Assistant

## 2021-02-26 ENCOUNTER — Other Ambulatory Visit: Payer: Self-pay

## 2021-02-26 VITALS — BP 146/90 | HR 61 | Temp 96.8°F | Ht 63.0 in | Wt 149.0 lb

## 2021-02-26 DIAGNOSIS — Z131 Encounter for screening for diabetes mellitus: Secondary | ICD-10-CM

## 2021-02-26 DIAGNOSIS — E785 Hyperlipidemia, unspecified: Secondary | ICD-10-CM

## 2021-02-26 DIAGNOSIS — Z7689 Persons encountering health services in other specified circumstances: Secondary | ICD-10-CM

## 2021-02-26 DIAGNOSIS — Z1211 Encounter for screening for malignant neoplasm of colon: Secondary | ICD-10-CM

## 2021-02-26 DIAGNOSIS — Z125 Encounter for screening for malignant neoplasm of prostate: Secondary | ICD-10-CM

## 2021-02-26 DIAGNOSIS — Z833 Family history of diabetes mellitus: Secondary | ICD-10-CM

## 2021-02-26 DIAGNOSIS — R030 Elevated blood-pressure reading, without diagnosis of hypertension: Secondary | ICD-10-CM

## 2021-02-26 LAB — COMPREHENSIVE METABOLIC PANEL
ALT: 53 U/L — ABNORMAL HIGH (ref 0–44)
AST: 32 U/L (ref 15–41)
Albumin: 4.1 g/dL (ref 3.5–5.0)
Alkaline Phosphatase: 77 U/L (ref 38–126)
Anion gap: 8 (ref 5–15)
BUN: 10 mg/dL (ref 6–20)
CO2: 25 mmol/L (ref 22–32)
Calcium: 8.8 mg/dL — ABNORMAL LOW (ref 8.9–10.3)
Chloride: 105 mmol/L (ref 98–111)
Creatinine, Ser: 0.68 mg/dL (ref 0.61–1.24)
GFR, Estimated: >60 mL/min (ref >60)
Glucose, Bld: 116 mg/dL — ABNORMAL HIGH (ref 70–99)
Potassium: 4.6 mmol/L (ref 3.5–5.1)
Sodium: 138 mmol/L (ref 135–145)
Total Bilirubin: 0.3 mg/dL (ref 0.3–1.2)
Total Protein: 7.1 g/dL (ref 6.5–8.1)

## 2021-02-26 LAB — HEMOGLOBIN A1C
Hgb A1c MFr Bld: 5.5 % (ref 4.8–5.6)
Mean Plasma Glucose: 111 mg/dL

## 2021-02-26 LAB — LIPID PANEL
Cholesterol: 308 mg/dL — ABNORMAL HIGH (ref 0–200)
HDL: 81 mg/dL (ref >40)
LDL Cholesterol: 212 mg/dL — ABNORMAL HIGH (ref 0–99)
Total CHOL/HDL Ratio: 3.8 RATIO
Triglycerides: 73 mg/dL (ref <150)
VLDL: 15 mg/dL (ref 0–40)

## 2021-02-26 LAB — PSA: Prostatic Specific Antigen: 0.93 ng/mL (ref 0.00–4.00)

## 2021-02-26 NOTE — Progress Notes (Signed)
BP (!) 146/90   Pulse 61   Temp (!) 96.8 F (36 C)   Ht 5\' 3"  (1.6 m)   Wt 149 lb (67.6 kg)   SpO2 99%   BMI 26.39 kg/m    Subjective:    Patient ID: Eric Warner, male    DOB: 1965-09-27, 55 y.o.   MRN: [id number]  HPI: Eric Warner is a 55 y.o. male presenting on 02/26/2021 for No chief complaint on file.   HPI  Pt had a negative covid 19 screening questionnaire.   Pt is 55yoM who presents to re-establish care.  He has Not been seen since 2019.  He says he got covid vaccination but doesn't have card  He is seeing [facility] for knee.  He says both knees hurt but he had MRI of the left knee done.    He is working 2020.    He reports no problems other than his knees.       Relevant past medical, surgical, family and social history reviewed and updated as indicated. Interim medical history since our last visit reviewed. Allergies and medications reviewed and updated.    No current outpatient medications on file.    Review of Systems  Per HPI unless specifically indicated above     Objective:    BP (!) 146/90   Pulse 61   Temp (!) 96.8 F (36 C)   Ht 5\' 3"  (1.6 m)   Wt 149 lb (67.6 kg)   SpO2 99%   BMI 26.39 kg/m   Wt Readings from Last 3 Encounters:  02/26/21 149 lb (67.6 kg)  04/10/18 148 lb (67.1 kg)  02/11/18 143 lb 8 oz (65.1 kg)    Physical Exam Vitals reviewed.  Constitutional:      General: He is not in acute distress.    Appearance: He is well-developed. He is not ill-appearing.  HENT:     Head: Normocephalic and atraumatic.  Eyes:     Extraocular Movements: Extraocular movements intact.     Conjunctiva/sclera: Conjunctivae normal.     Pupils: Pupils are equal, round, and reactive to light.  Neck:     Thyroid: No thyromegaly.  Cardiovascular:     Rate and Rhythm: Normal rate and regular rhythm.  Pulmonary:     Effort: Pulmonary effort is normal.     Breath sounds: Normal breath sounds. No  wheezing or rales.  Abdominal:     General: Bowel sounds are normal.     Palpations: Abdomen is soft. There is no mass.     Tenderness: There is no abdominal tenderness.  Musculoskeletal:     Cervical back: Neck supple.     Right lower leg: No edema.     Left lower leg: No edema.  Lymphadenopathy:     Cervical: No cervical adenopathy.  Skin:    General: Skin is warm and dry.     Findings: No rash.  Neurological:     Mental Status: He is alert and oriented to person, place, and time.     Motor: No weakness or tremor.     Gait: Gait is intact.  Psychiatric:        Attention and Perception: Attention normal.        Speech: Speech normal.        Behavior: Behavior normal. Behavior is cooperative.           Assessment & Plan:    Encounter Diagnoses  Name Primary?   Encounter to establish care  Yes   Hyperlipidemia, unspecified hyperlipidemia type    Screening for diabetes mellitus    Family history of diabetes mellitus    Screening for prostate cancer    Screening for colon cancer    Elevated blood pressure reading        -will update Labs -pt was given FIT test for colon cancer screening  -pt is elevated today with no history HTN.  will Recheck bp 1 month. -Pt to contact office sooner prn

## 2021-03-21 LAB — IFOBT (OCCULT BLOOD): IFOBT: NEGATIVE

## 2021-03-27 ENCOUNTER — Ambulatory Visit: Payer: Self-pay | Admitting: Physician Assistant

## 2021-03-27 ENCOUNTER — Other Ambulatory Visit: Payer: Self-pay

## 2021-03-27 ENCOUNTER — Encounter: Payer: Self-pay | Admitting: Physician Assistant

## 2021-03-27 VITALS — BP 136/79 | HR 72 | Temp 97.3°F | Wt 150.0 lb

## 2021-03-27 DIAGNOSIS — E785 Hyperlipidemia, unspecified: Secondary | ICD-10-CM

## 2021-03-27 DIAGNOSIS — M170 Bilateral primary osteoarthritis of knee: Secondary | ICD-10-CM

## 2021-03-27 DIAGNOSIS — Z789 Other specified health status: Secondary | ICD-10-CM

## 2021-03-27 MED ORDER — ROSUVASTATIN CALCIUM 20 MG PO TABS: 20.0000 mg | ORAL_TABLET | Freq: Every day | ORAL | 4 refills | Status: DC

## 2021-03-27 NOTE — Progress Notes (Signed)
BP 136/79   Pulse 72   Temp (!) 97.3 F (36.3 C)   Wt 150 lb (68 kg)   SpO2 94%   BMI 26.57 kg/m    Subjective:    Patient ID: Eric Warner, male    DOB: 1966/01/30, 55 y.o.   MRN: [id number]  HPI: Eric Warner is a 55 y.o. male presenting on 03/27/2021 for Follow-up   HPI   Pt had a negative covid 19 screening questionnaire.   Chief Complaint  Patient presents with   Follow-up     Pt forgot to bring his covid card for the information to be entered into epic.  Pt takes mobic for knee pain bilaterally.  He continues to see the orthopedist for this.  Other than his knees, he is doing well.     Relevant past medical, surgical, family and social history reviewed and updated as indicated. Interim medical history since our last visit reviewed. Allergies and medications reviewed and updated.   Current Outpatient Medications:    meloxicam (MOBIC) 15 MG tablet, Take 15 mg by mouth daily., Disp: , Rfl:       Review of Systems  Per HPI unless specifically indicated above     Objective:    BP 136/79   Pulse 72   Temp (!) 97.3 F (36.3 C)   Wt 150 lb (68 kg)   SpO2 94%   BMI 26.57 kg/m   Wt Readings from Last 3 Encounters:  03/27/21 150 lb (68 kg)  02/26/21 149 lb (67.6 kg)  04/10/18 148 lb (67.1 kg)    Physical Exam Vitals reviewed.  Constitutional:      General: He is not in acute distress.    Appearance: He is well-developed. He is not ill-appearing.  HENT:     Head: Normocephalic and atraumatic.  Cardiovascular:     Rate and Rhythm: Normal rate and regular rhythm.  Pulmonary:     Effort: Pulmonary effort is normal.     Breath sounds: Normal breath sounds. No wheezing.  Abdominal:     General: Bowel sounds are normal.     Palpations: Abdomen is soft.     Tenderness: There is no abdominal tenderness.  Musculoskeletal:     Cervical back: Neck supple.     Right lower leg: No edema.     Left lower leg: No edema.   Lymphadenopathy:     Cervical: No cervical adenopathy.  Skin:    General: Skin is warm and dry.  Neurological:     Mental Status: He is alert and oriented to person, place, and time.  Psychiatric:        Behavior: Behavior normal.    Results for orders placed or performed during the hospital encounter of 02/26/21  Hemoglobin A1c  Result Value Ref Range   Hgb A1c MFr Bld 5.5 4.8 - 5.6 %   Mean Plasma Glucose 111 mg/dL  PSA  Result Value Ref Range   Prostatic Specific Antigen 0.93 0.00 - 4.00 ng/mL  Lipid panel  Result Value Ref Range   Cholesterol 308 (H) 0 - 200 mg/dL   Triglycerides 73 <371 mg/dL   HDL 81 >69 mg/dL   Total CHOL/HDL Ratio 3.8 RATIO   VLDL 15 0 - 40 mg/dL   LDL Cholesterol 678 (H) 0 - 99 mg/dL  Comprehensive metabolic panel  Result Value Ref Range   Sodium 138 135 - 145 mmol/L   Potassium 4.6 3.5 - 5.1 mmol/L   Chloride 105 98 -  111 mmol/L   CO2 25 22 - 32 mmol/L   Glucose, Bld 116 (H) 70 - 99 mg/dL   BUN 10 6 - 20 mg/dL   Creatinine, Ser 9.39 0.61 - 1.24 mg/dL   Calcium 8.8 (L) 8.9 - 10.3 mg/dL   Total Protein 7.1 6.5 - 8.1 g/dL   Albumin 4.1 3.5 - 5.0 g/dL   AST 32 15 - 41 U/L   ALT 53 (H) 0 - 44 U/L   Alkaline Phosphatase 77 38 - 126 U/L   Total Bilirubin 0.3 0.3 - 1.2 mg/dL   GFR, Estimated >03 >00 mL/min   Anion gap 8 5 - 15      Assessment & Plan:    Encounter Diagnoses  Name Primary?   Hyperlipidemia, unspecified hyperlipidemia type Yes   Not proficient in English language    Osteoarthritis of both knees, unspecified osteoarthritis type       -reviewed labs with pt  -Rx crestor  .  Counseled on lowfat diet.  Gave reading information.  -will monitor bp -Pt to follow up 3 months.  He is to contact office sooner prn

## 2021-06-14 ENCOUNTER — Other Ambulatory Visit: Payer: Self-pay | Admitting: Physician Assistant

## 2021-06-14 DIAGNOSIS — E785 Hyperlipidemia, unspecified: Secondary | ICD-10-CM

## 2021-06-27 ENCOUNTER — Ambulatory Visit: Payer: Self-pay | Admitting: Physician Assistant

## 2021-06-27 ENCOUNTER — Encounter: Payer: Self-pay | Admitting: Physician Assistant

## 2021-06-27 DIAGNOSIS — E785 Hyperlipidemia, unspecified: Secondary | ICD-10-CM

## 2021-06-27 NOTE — Progress Notes (Signed)
   There were no vitals taken for this visit.   Subjective:    Patient ID: Eric Warner, male    DOB: [birth year], 55 y.o.   MRN: [id number]  HPI: Eric Warner is a 55 y.o. male presenting on 06/27/2021 for No chief complaint on file.   HPI   This is a telemedicine appointment through [employer]  I connected with  Eric Warner on 06/27/21 by a video enabled telemedicine application and verified that I am speaking with the correct person using two identifiers.   I discussed the limitations of evaluation and management by telemedicine. The patient expressed understanding and agreed to proceed.  Pt is outside somewhere.  Provider is at office.   Pt is 55yoM with routine follow up for dyslipidemia.   He forgot to get his labs drawn.  He says he is doing well and has no complaints.    Relevant past medical, surgical, family and social history reviewed and updated as indicated. Interim medical history since our last visit reviewed. Allergies and medications reviewed and updated.   Current Outpatient Medications:    rosuvastatin (CRESTOR) 20 MG tablet, Take 1 tablet (20 mg total) by mouth daily. Tome una tableta por boca diaria, Disp: 30 tablet, Rfl: 4   meloxicam (MOBIC) 15 MG tablet, Take 15 mg by mouth daily., Disp: , Rfl:     Review of Systems  Per HPI unless specifically indicated above     Objective:    There were no vitals taken for this visit.  Wt Readings from Last 3 Encounters:  03/27/21 150 lb (68 kg)  02/26/21 149 lb (67.6 kg)  04/10/18 148 lb (67.1 kg)    Physical Exam Constitutional:      General: He is not in acute distress.    Appearance: He is not ill-appearing.  HENT:     Head: Normocephalic and atraumatic.  Pulmonary:     Effort: No respiratory distress.     Comments: Pt is talking in complete sentences without dyspnea Neurological:     Mental Status: He is alert and oriented to person, place, and time.  Psychiatric:         Attention and Perception: Attention normal.        Speech: Speech normal.        Behavior: Behavior normal.          Assessment & Plan:     Encounter Diagnosis  Name Primary?   Hyperlipidemia, unspecified hyperlipidemia type Yes     -pt is encouraged to get blood labs drawn.  He will be called with results -pt to continue medication -pt to follow up 3 months.  He is to contact office sooner prn

## 2021-09-17 ENCOUNTER — Other Ambulatory Visit: Payer: Self-pay | Admitting: Physician Assistant

## 2021-09-17 DIAGNOSIS — E785 Hyperlipidemia, unspecified: Secondary | ICD-10-CM

## 2021-09-25 ENCOUNTER — Other Ambulatory Visit (HOSPITAL_COMMUNITY)
Admission: RE | Admit: 2021-09-25 | Discharge: 2021-09-25 | Disposition: A | Discharge status: Home / Self Care | Payer: Self-pay | Source: Ambulatory Visit | Attending: Physician Assistant | Admitting: Physician Assistant

## 2021-09-25 ENCOUNTER — Other Ambulatory Visit: Payer: Self-pay

## 2021-09-25 DIAGNOSIS — E785 Hyperlipidemia, unspecified: Secondary | ICD-10-CM | POA: Insufficient documentation

## 2021-09-25 LAB — LIPID PANEL
Cholesterol: 268 mg/dL — ABNORMAL HIGH (ref 0–200)
HDL: 68 mg/dL (ref >40)
LDL Cholesterol: 182 mg/dL — ABNORMAL HIGH (ref 0–99)
Total CHOL/HDL Ratio: 3.9 RATIO
Triglycerides: 89 mg/dL (ref <150)
VLDL: 18 mg/dL (ref 0–40)

## 2021-09-25 LAB — HEPATIC FUNCTION PANEL
ALT: 52 U/L — ABNORMAL HIGH (ref 0–44)
AST: 35 U/L (ref 15–41)
Albumin: 4.3 g/dL (ref 3.5–5.0)
Alkaline Phosphatase: 81 U/L (ref 38–126)
Bilirubin, Direct: <0.1 mg/dL (ref 0.0–0.2)
Total Bilirubin: 0.8 mg/dL (ref 0.3–1.2)
Total Protein: 7.5 g/dL (ref 6.5–8.1)

## 2021-10-01 ENCOUNTER — Encounter: Payer: Self-pay | Admitting: Physician Assistant

## 2021-10-01 ENCOUNTER — Ambulatory Visit: Payer: Self-pay | Admitting: Physician Assistant

## 2021-10-01 VITALS — BP 152/100 | HR 60 | Temp 96.3°F

## 2021-10-01 DIAGNOSIS — E785 Hyperlipidemia, unspecified: Secondary | ICD-10-CM

## 2021-10-01 DIAGNOSIS — I10 Essential (primary) hypertension: Secondary | ICD-10-CM

## 2021-10-01 MED ORDER — ROSUVASTATIN CALCIUM 40 MG PO TABS: 40.0000 mg | ORAL_TABLET | Freq: Every day | ORAL | 4 refills | Status: DC

## 2021-10-01 MED ORDER — LOSARTAN POTASSIUM 50 MG PO TABS: 50.0000 mg | ORAL_TABLET | Freq: Every day | ORAL | 4 refills | Status: DC

## 2021-10-01 NOTE — Progress Notes (Signed)
BP (!) 152/100    Pulse 60    Temp (!) 96.3 F (35.7 C)    SpO2 98%    Subjective:    Patient ID: Eric Warner, male    DOB: [birth year], 56 y.o.   MRN: [id number]  HPI: Eric Warner is a 56 y.o. male presenting on 10/01/2021 for Hyperlipidemia   HPI  Chief Complaint  Patient presents with   Hyperlipidemia     Pt says he is doing well and he has no complaints   Relevant past medical, surgical, family and social history reviewed and updated as indicated. Interim medical history since our last visit reviewed. Allergies and medications reviewed and updated.   Current Outpatient Medications:    rosuvastatin (CRESTOR) 20 MG tablet, Take 1 tablet (20 mg total) by mouth daily. Tome una tableta por boca diaria, Disp: 30 tablet, Rfl: 4    Review of Systems  Per HPI unless specifically indicated above     Objective:    BP (!) 152/100    Pulse 60    Temp (!) 96.3 F (35.7 C)    SpO2 98%   Wt Readings from Last 3 Encounters:  03/27/21 150 lb (68 kg)  02/26/21 149 lb (67.6 kg)  04/10/18 148 lb (67.1 kg)    Physical Exam Vitals reviewed.  Constitutional:      General: He is not in acute distress.    Appearance: He is well-developed. He is not ill-appearing.  HENT:     Head: Normocephalic and atraumatic.  Cardiovascular:     Rate and Rhythm: Normal rate and regular rhythm.  Pulmonary:     Effort: Pulmonary effort is normal.     Breath sounds: Normal breath sounds. No wheezing.  Abdominal:     General: Bowel sounds are normal.     Palpations: Abdomen is soft.     Tenderness: There is no abdominal tenderness.  Musculoskeletal:     Cervical back: Neck supple.  Lymphadenopathy:     Cervical: No cervical adenopathy.  Skin:    General: Skin is warm and dry.  Neurological:     Mental Status: He is alert and oriented to person, place, and time.  Psychiatric:        Behavior: Behavior normal.    Results for orders placed or performed during the  hospital encounter of 09/25/21  Lipid panel  Result Value Ref Range   Cholesterol 268 (H) 0 - 200 mg/dL   Triglycerides 89 <482 mg/dL   HDL 68 >50 mg/dL   Total CHOL/HDL Ratio 3.9 RATIO   VLDL 18 0 - 40 mg/dL   LDL Cholesterol 037 (H) 0 - 99 mg/dL  Hepatic function panel  Result Value Ref Range   Total Protein 7.5 6.5 - 8.1 g/dL   Albumin 4.3 3.5 - 5.0 g/dL   AST 35 15 - 41 U/L   ALT 52 (H) 0 - 44 U/L   Alkaline Phosphatase 81 38 - 126 U/L   Total Bilirubin 0.8 0.3 - 1.2 mg/dL   Bilirubin, Direct <0.4 0.0 - 0.2 mg/dL   Indirect Bilirubin NOT CALCULATED 0.3 - 0.9 mg/dL      Assessment & Plan:    Encounter Diagnoses  Name Primary?   Hyperlipidemia, unspecified hyperlipidemia type Yes   Primary hypertension      -reviewed labs with t -Increase crestor -Add losartan.  He is given reading information on HTN -pt was encouraged to get Covid booster -pt to follow up 3 months.  He  is to contact office sooner prn

## 2021-10-01 NOTE — Patient Instructions (Signed)
Hipertensión en los adultos °Hypertension, Adult °La presión arterial alta (hipertensión) se produce cuando la fuerza de la sangre bombea a través de las arterias con mucha fuerza. Las arterias son los vasos sanguíneos que transportan la sangre desde el corazón al resto del cuerpo. La hipertensión hace que el corazón haga más esfuerzo para bombear sangre y puede provocar que las arterias se estrechen o endurezcan. La hipertensión no tratada o no controlada puede causar infarto de miocardio, insuficiencia cardíaca, accidente cerebrovascular, enfermedad renal y otros problemas. °Una lectura de la presión arterial consta de un número más alto sobre un número más bajo. En condiciones ideales, la presión arterial debe estar por debajo de 120/80. El primer número (“superior”) es la presión sistólica. Es la medida de la presión de las arterias cuando el corazón late. El segundo número (“inferior”) es la presión diastólica. Es la medida de la presión en las arterias cuando el corazón se relaja. °¿Cuáles son las causas? °Se desconoce la causa exacta de esta afección. Hay algunas afecciones que causan presión arterial alta o están relacionadas con ella. °¿Qué incrementa el riesgo? °Algunos factores de riesgo de hipertensión están bajo su control. Los siguientes factores pueden hacer que sea más propenso a desarrollar esta afección: °Fumar. °Tener diabetes mellitus tipo 2, colesterol alto, o ambos. °No hacer la cantidad suficiente de actividad física o ejercicio. °Tener sobrepeso. °Consumir mucha grasa, azúcar, calorías o sal (sodio) en su dieta. °Beber alcohol en exceso. °Algunos factores de riesgo para la presión arterial alta pueden ser difíciles o imposibles de cambiar. Algunos de estos factores son los siguientes: °Tener enfermedad renal crónica. °Tener antecedentes familiares de presión arterial alta. °Edad. Los riesgos aumentan con la edad. °Raza. El riesgo es mayor para las personas afroamericanas. °Sexo. Antes de los  45 años, los hombres corren más riesgo que las mujeres. Después de los 65 años, las mujeres corren más riesgo que los hombres. °Tener apnea obstructiva del sueño. °Estrés. °¿Cuáles son los signos o los síntomas? °Es posible que la presión arterial alta puede no cause síntomas. La presión arterial muy alta (crisis hipertensiva) puede provocar: °Dolor de cabeza. °Ansiedad. °Falta de aire. °Hemorragia nasal. °Náuseas y vómitos. °Cambios en la visión. °Dolor de pecho intenso. °Convulsiones. °¿Cómo se diagnostica? °Esta afección se diagnostica al medir su presión arterial mientras se encuentra sentado, con el brazo apoyado sobre una superficie plana, las piernas sin cruzar y los pies bien apoyados en el piso. El brazalete del tensiómetro debe colocarse directamente sobre la piel de la parte superior del brazo y al nivel de su corazón. Debe medirla al menos dos veces en el mismo brazo. Determinadas condiciones pueden causar una diferencia de presión arterial entre el brazo izquierdo y el derecho. °Ciertos factores pueden provocar que las lecturas de la presión arterial sean inferiores o superiores a lo normal por un período corto de tiempo: °Si su presión arterial es más alta cuando se encuentra en el consultorio del médico que cuando la mide en su hogar, se denomina “hipertensión de bata blanca”. La mayoría de las personas que tienen esta afección no deben ser medicadas. °Si su presión arterial es más alta en el hogar que cuando se encuentra en el consultorio del médico, se denomina “hipertensión enmascarada”. La mayoría de las personas que tienen esta afección deben ser medicadas para controlar la presión arterial. °Si tiene una lecturas de presión arterial alta durante una visita o si tiene presión arterial normal con otros factores de riesgo, se le podrá pedir que haga lo siguiente: °  Que regrese otro día para volver a controlar su presión arterial nuevamente. °Que se controle la presión arterial en su casa durante 1  semana o más. °Si se le diagnostica hipertensión, es posible que se le realicen otros análisis de sangre o estudios de diagnóstico por imágenes para ayudar a su médico a comprender su riesgo general de tener otras afecciones. °¿Cómo se trata? °Esta afección se trata haciendo cambios saludables en el estilo de vida, tales como ingerir alimentos saludables, realizar más ejercicio y reducir el consumo de alcohol. El médico puede recetarle medicamentos si los cambios en el estilo de vida no son suficientes para lograr controlar la presión arterial y si: °Su presión arterial sistólica está por encima de 130. °Su presión arterial diastólica está por encima de 80. °La presión arterial deseada puede variar en función de las enfermedades, la edad y otros factores personales. °Siga estas instrucciones en su casa: °Comida y bebida ° °Siga una dieta con alto contenido de fibras y potasio, y con bajo contenido de sodio, azúcar agregada y grasas. Un ejemplo de plan alimenticio es la dieta DASH (Dietary Approaches to Stop Hypertension, Métodos alimenticios para detener la hipertensión). Para alimentarse de esta manera: °Coma mucha fruta y verdura fresca. Trate de que la mitad del plato de cada comida sea de frutas y verduras. °Coma cereales integrales, como pasta integral, arroz integral o pan integral. Llene aproximadamente un cuarto del plato con cereales integrales. °Coma y beba productos lácteos con bajo contenido de grasa, como leche descremada o yogur bajo en grasas. °Evite la ingesta de cortes de carne grasa, carne procesada o curada, y carne de ave con piel. Llene aproximadamente un cuarto del plato con proteínas magras, como pescado, pollo sin piel, frijoles, huevos o tofu. °Evite ingerir alimentos prehechos y procesados. En general, estos tienen mayor cantidad de sodio, azúcar agregada y grasa. °Reduzca su ingesta diaria de sodio. La mayoría de las personas que tienen hipertensión deben comer menos de 1500 mg de sodio  por día. °No beba alcohol si: °Su médico le indica no hacerlo. °Está embarazada, puede estar embarazada o está tratando de quedar embarazada. °Si bebe alcohol: °Limite la cantidad que bebe a lo siguiente: °De 0 a 1 medida por día para las mujeres. °De 0 a 2 medidas por día para los hombres. °Esté atento a la cantidad de alcohol que hay en las bebidas que toma. En los [place], una medida equivale a una botella de cerveza de 12 oz (355 ml), un vaso de vino de 5 oz (148 ml) o un vaso de una bebida alcohólica de alta graduación de 1½ oz (44 ml). °Estilo de vida ° °Trabaje con su médico para mantener un peso saludable o perder peso. Pregúntele cuál es el peso recomendado para usted. °Haga al menos 30 minutos de ejercicio la mayoría de los días de la semana. Estas actividades pueden incluir caminar, nadar o andar en bicicleta. °Incluya ejercicios para fortalecer sus músculos (ejercicios de resistencia), como Pilates o levantamiento de pesas, como parte de su rutina semanal de ejercicios. Intente realizar 30 minutos de este tipo de ejercicios al menos tres días a la semana. °No consuma ningún producto que contenga nicotina o tabaco, como cigarrillos, cigarrillos electrónicos y tabaco de mascar. Si necesita ayuda para dejar de fumar, consulte al médico. °Contrólese la presión arterial en su casa según las indicaciones del médico. °Concurra a todas las visitas de seguimiento como se lo haya indicado el médico. Esto es importante. °Medicamentos °Tome los medicamentos de venta   libre y los recetados solamente como se lo haya indicado el médico. Siga cuidadosamente las indicaciones. Los medicamentos para la presión arterial deben tomarse según las indicaciones. °No omita las dosis de medicamentos para la presión arterial. Si lo hace, estará en riesgo de tener problemas y puede hacer que los medicamentos sean menos eficaces. °Pregúntele a su médico a qué efectos secundarios o reacciones a los medicamentos debe prestar  atención. °Comuníquese con un médico si: °Piensa que tiene una reacción a un medicamento que está tomando. °Tiene dolores de cabeza frecuentes (recurrentes). °Se siente mareado. °Tiene hinchazón en los tobillos. °Tiene problemas de visión. °Solicite ayuda inmediatamente si: °Siente un dolor de cabeza intenso o confusión. °Siente debilidad inusual o adormecimiento. °Siente que va a desmayarse. °Siente un dolor intenso en el pecho o el abdomen. °Vomita repetidas veces. °Tiene dificultad para respirar. °Resumen °La hipertensión se produce cuando la sangre bombea en las arterias con mucha fuerza. Si esta afección no se controla, podría correr riesgo de tener complicaciones graves. °La presión arterial deseada puede variar en función de las enfermedades, la edad y otros factores personales. Para la mayoría de las personas, una presión arterial normal es menor que 120/80. °La hipertensión se trata con cambios en el estilo de vida, medicamentos o una combinación de ambos. Los cambios en el estilo de vida incluyen pérdida de peso, ingerir alimentos sanos, seguir una dieta baja en sodio, hacer más ejercicio y limitar el consumo de alcohol. °Esta información no tiene como fin reemplazar el consejo del médico. Asegúrese de hacerle al médico cualquier pregunta que tenga. °Document Revised: 06/11/2018 Document Reviewed: 06/11/2018 °Elsevier Patient Education © 2022 [employer]. ° °

## 2021-12-10 ENCOUNTER — Other Ambulatory Visit: Payer: Self-pay | Admitting: Physician Assistant

## 2021-12-10 DIAGNOSIS — I10 Essential (primary) hypertension: Secondary | ICD-10-CM

## 2021-12-10 DIAGNOSIS — E785 Hyperlipidemia, unspecified: Secondary | ICD-10-CM

## 2021-12-10 DIAGNOSIS — Z125 Encounter for screening for malignant neoplasm of prostate: Secondary | ICD-10-CM

## 2021-12-31 ENCOUNTER — Ambulatory Visit: Payer: Self-pay | Admitting: Physician Assistant

## 2022-01-04 ENCOUNTER — Other Ambulatory Visit (HOSPITAL_COMMUNITY)
Admission: RE | Admit: 2022-01-04 | Discharge: 2022-01-04 | Disposition: A | Discharge status: Home / Self Care | Payer: Self-pay | Source: Ambulatory Visit | Attending: Physician Assistant | Admitting: Physician Assistant

## 2022-01-04 DIAGNOSIS — Z125 Encounter for screening for malignant neoplasm of prostate: Secondary | ICD-10-CM | POA: Insufficient documentation

## 2022-01-04 DIAGNOSIS — E785 Hyperlipidemia, unspecified: Secondary | ICD-10-CM | POA: Insufficient documentation

## 2022-01-04 DIAGNOSIS — I10 Essential (primary) hypertension: Secondary | ICD-10-CM | POA: Insufficient documentation

## 2022-01-04 LAB — COMPREHENSIVE METABOLIC PANEL
ALT: 61 U/L — ABNORMAL HIGH (ref 0–44)
AST: 40 U/L (ref 15–41)
Albumin: 4.1 g/dL (ref 3.5–5.0)
Alkaline Phosphatase: 85 U/L (ref 38–126)
Anion gap: 6 (ref 5–15)
BUN: 16 mg/dL (ref 6–20)
CO2: 22 mmol/L (ref 22–32)
Calcium: 8.9 mg/dL (ref 8.9–10.3)
Chloride: 110 mmol/L (ref 98–111)
Creatinine, Ser: 0.85 mg/dL (ref 0.61–1.24)
GFR, Estimated: >60 mL/min (ref >60)
Glucose, Bld: 114 mg/dL — ABNORMAL HIGH (ref 70–99)
Potassium: 4.3 mmol/L (ref 3.5–5.1)
Sodium: 138 mmol/L (ref 135–145)
Total Bilirubin: 0.7 mg/dL (ref 0.3–1.2)
Total Protein: 7 g/dL (ref 6.5–8.1)

## 2022-01-04 LAB — LIPID PANEL
Cholesterol: 189 mg/dL (ref 0–200)
HDL: 61 mg/dL (ref >40)
LDL Cholesterol: 102 mg/dL — ABNORMAL HIGH (ref 0–99)
Total CHOL/HDL Ratio: 3.1 RATIO
Triglycerides: 129 mg/dL (ref <150)
VLDL: 26 mg/dL (ref 0–40)

## 2022-01-04 LAB — PSA: Prostatic Specific Antigen: 0.71 ng/mL (ref 0.00–4.00)

## 2022-01-10 ENCOUNTER — Encounter: Payer: Self-pay | Admitting: Physician Assistant

## 2022-01-10 ENCOUNTER — Ambulatory Visit: Payer: Self-pay | Admitting: Physician Assistant

## 2022-01-10 VITALS — BP 142/90 | HR 67 | Temp 97.3°F

## 2022-01-10 DIAGNOSIS — E785 Hyperlipidemia, unspecified: Secondary | ICD-10-CM

## 2022-01-10 DIAGNOSIS — I10 Essential (primary) hypertension: Secondary | ICD-10-CM

## 2022-01-10 MED ORDER — ROSUVASTATIN CALCIUM 40 MG PO TABS: 40.0000 mg | ORAL_TABLET | Freq: Every day | ORAL | 4 refills | Status: DC

## 2022-01-10 MED ORDER — LOSARTAN POTASSIUM 100 MG PO TABS: 100.0000 mg | ORAL_TABLET | Freq: Every day | ORAL | 4 refills | Status: DC

## 2022-01-10 NOTE — Progress Notes (Signed)
? ?BP (!) 142/90   Pulse 67   Temp (!) 97.3 ?F (36.3 ?C)   SpO2 99%   ? ?Subjective:  ? ? Patient ID: Eric Warner, male    DOB: [birth year], 56 y.o.   MRN: [id number] ? ?HPI: ?Eric Warner is a 56 y.o. male presenting on 01/10/2022 for Hypertension and Hyperlipidemia ? ? ?HPI ? ? ?Chief Complaint  ?Patient presents with  ? Hypertension  ? Hyperlipidemia  ? ? ?Pt is doing well.  He says he occasionally has his left index finger wants to lock up.  He says it only happens once every week or so.  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  ? ? ?Relevant past medical, surgical, family and social history reviewed and updated as indicated. Interim medical history since our last visit reviewed. ?Allergies and medications reviewed and updated. ? ? ?Current Outpatient Medications:  ?  losartan (COZAAR) 50 MG tablet, Take 1 tablet (50 mg total) by mouth daily., Disp: 30 tablet, Rfl: 4 ?  rosuvastatin (CRESTOR) 40 MG tablet, Take 1 tablet (40 mg total) by mouth daily., Disp: 30 tablet, Rfl: 4 ? ? ?Review of Systems ? ?Per HPI unless specifically indicated above ? ?   ?Objective:  ?  ?BP (!) 142/90   Pulse 67   Temp (!) 97.3 ?F (36.3 ?C)   SpO2 99%   ?Wt Readings from Last 3 Encounters:  ?03/27/21 150 lb (68 kg)  ?02/26/21 149 lb (67.6 kg)  ?04/10/18 148 lb (67.1 kg)  ?  ?Physical Exam ?Vitals reviewed.  ?Constitutional:   ?   General: He is not in acute distress. ?   Appearance: He is well-developed. He is not ill-appearing.  ?HENT:  ?   Head: Normocephalic and atraumatic.  ?Cardiovascular:  ?   Rate and Rhythm: Normal rate and regular rhythm.  ?Pulmonary:  ?   Effort: Pulmonary effort is normal.  ?   Breath sounds: Normal breath sounds. No wheezing.  ?Abdominal:  ?   General: Bowel sounds are normal.  ?   Palpations: Abdomen is soft.  ?   Tenderness: There is no abdominal tenderness.  ?Musculoskeletal:  ?   Left hand: No swelling, deformity, tenderness or bony tenderness. Normal  range of motion. Normal strength. Normal capillary refill. Normal pulse.  ?   Cervical back: Neck supple.  ?   Right lower leg: No edema.  ?   Left lower leg: No edema.  ?Lymphadenopathy:  ?   Cervical: No cervical adenopathy.  ?Skin: ?   General: Skin is warm and dry.  ?Neurological:  ?   Mental Status: He is alert and oriented to person, place, and time.  ?Psychiatric:     ?   Behavior: Behavior normal.  ? ? ?Results for orders placed or performed during the hospital encounter of 01/04/22  ?Comprehensive metabolic panel  ?Result Value Ref Range  ? Sodium 138 135 - 145 mmol/L  ? Potassium 4.3 3.5 - 5.1 mmol/L  ? Chloride 110 98 - 111 mmol/L  ? CO2 22 22 - 32 mmol/L  ? Glucose, Bld 114 (H) 70 - 99 mg/dL  ? BUN 16 6 - 20 mg/dL  ? Creatinine, Ser 0.85 0.61 - 1.24 mg/dL  ? Calcium 8.9 8.9 - 10.3 mg/dL  ? Total Protein 7.0 6.5 - 8.1 g/dL  ? Albumin 4.1 3.5 - 5.0 g/dL  ? AST 40 15 - 41 U/L  ? ALT 61 (H) 0 - 44 U/L  ?  Alkaline Phosphatase 85 38 - 126 U/L  ? Total Bilirubin 0.7 0.3 - 1.2 mg/dL  ? GFR, Estimated >60 >60 mL/min  ? Anion gap 6 5 - 15  ?Lipid panel  ?Result Value Ref Range  ? Cholesterol 189 0 - 200 mg/dL  ? Triglycerides 129 <150 mg/dL  ? HDL 61 >40 mg/dL  ? Total CHOL/HDL Ratio 3.1 RATIO  ? VLDL 26 0 - 40 mg/dL  ? LDL Cholesterol 102 (H) 0 - 99 mg/dL  ?PSA  ?Result Value Ref Range  ? Prostatic Specific Antigen 0.71 0.00 - 4.00 ng/mL  ? ?   ?Assessment & Plan:  ? ? ?Encounter Diagnoses  ?Name Primary?  ? Primary hypertension Yes  ? Hyperlipidemia, unspecified hyperlipidemia type   ? ? ? ?-reviewed labs with pt ?-pt to continue current crestor ?-will increase losartan ?-discussed finger options - to refer to orthopedics for possible injection or surgery.  Discussed that he may prefer to wait until it becomes a more regular issue.  He agrees ?-pt to follow up 3 months.  He is to contact office sooner prn ? ? ?

## 2022-04-04 ENCOUNTER — Other Ambulatory Visit: Payer: Self-pay | Admitting: Physician Assistant

## 2022-04-04 DIAGNOSIS — R7989 Other specified abnormal findings of blood chemistry: Secondary | ICD-10-CM

## 2022-04-04 DIAGNOSIS — I10 Essential (primary) hypertension: Secondary | ICD-10-CM

## 2022-04-04 DIAGNOSIS — E785 Hyperlipidemia, unspecified: Secondary | ICD-10-CM

## 2022-04-25 ENCOUNTER — Ambulatory Visit: Payer: Self-pay | Admitting: Physician Assistant

## 2022-05-26 ENCOUNTER — Other Ambulatory Visit: Payer: Self-pay | Admitting: Physician Assistant

## 2023-05-27 DIAGNOSIS — Z139 Encounter for screening, unspecified: Secondary | ICD-10-CM

## 2023-05-27 LAB — GLUCOSE, POCT (MANUAL RESULT ENTRY): POC Glucose: 115 mg/dL — AB (ref 70–99)

## 2023-05-27 NOTE — Congregational Nurse Program (Addendum)
Pt attended [facility] to complete enrollment into the Care Connect Uninsured Program and was referred to me to conduct health screening/interview due to patient has not seen PCP in since 2023  Chief Reasons Needed for PCP -pt has concerns of feeling dizzy and admits to not taking his BP and Cholesterol meds since last year due to moving to [place] in last year (2023) and didn't know how to re-connect to a PCP in that area  Vital Signs Checked  (See readings in vitals section of chart) Blood Glucose Checked  (nonfasting) 115 (WNL)   BP Checked   (ABN)    Socio-determinants health needs identified: - None at this time  -PHQ 9 = 0  (Low) - denies any behavioral health concerns at this time    Plan/- Enrollment initiated Care Connect Program by Tami Lin (Care Guide) on today 9.17.24, however pt has to return with remaining requested documents in order to complete enrollment process  -Pt was educated on meaning of abnormal bp aand provided facts on how to improve the values as it relates to changing diet.      Also educated and demonstrated on how to read food labels to assist with being more mindful with selection of foods as it relates to sodium, carbs and fats   No appointment was yet scheduled on today due to  his Care Connect Enrollment Process not complete and pt results were not in the hypertensive crisis rang.   However an appointment with the [facility] [place] will be scheduled upon his return to the [facility] on 9.18.24.

## 2023-06-02 NOTE — Congregational Nurse Program (Signed)
Scheduled pt appointment for [facility] for 9.24.24 and reviewed all information needed to prepare for his appointment

## 2023-06-03 ENCOUNTER — Ambulatory Visit: Payer: Self-pay | Admitting: Physician Assistant

## 2023-06-03 ENCOUNTER — Other Ambulatory Visit: Payer: Self-pay | Admitting: Physician Assistant

## 2023-06-03 ENCOUNTER — Other Ambulatory Visit (HOSPITAL_COMMUNITY)
Admission: RE | Admit: 2023-06-03 | Discharge: 2023-06-03 | Disposition: A | Discharge status: Home / Self Care | Payer: Self-pay | Source: Ambulatory Visit | Attending: Physician Assistant | Admitting: Physician Assistant

## 2023-06-03 ENCOUNTER — Encounter: Payer: Self-pay | Admitting: Physician Assistant

## 2023-06-03 VITALS — BP 142/89 | HR 97 | Temp 97.8°F | Wt 161.5 lb

## 2023-06-03 DIAGNOSIS — E785 Hyperlipidemia, unspecified: Secondary | ICD-10-CM | POA: Insufficient documentation

## 2023-06-03 DIAGNOSIS — R7989 Other specified abnormal findings of blood chemistry: Secondary | ICD-10-CM | POA: Insufficient documentation

## 2023-06-03 DIAGNOSIS — Z7689 Persons encountering health services in other specified circumstances: Secondary | ICD-10-CM

## 2023-06-03 DIAGNOSIS — Z131 Encounter for screening for diabetes mellitus: Secondary | ICD-10-CM

## 2023-06-03 DIAGNOSIS — Z125 Encounter for screening for malignant neoplasm of prostate: Secondary | ICD-10-CM | POA: Insufficient documentation

## 2023-06-03 DIAGNOSIS — I10 Essential (primary) hypertension: Secondary | ICD-10-CM

## 2023-06-03 DIAGNOSIS — Z1211 Encounter for screening for malignant neoplasm of colon: Secondary | ICD-10-CM

## 2023-06-03 LAB — COMPREHENSIVE METABOLIC PANEL
ALT: 79 U/L — ABNORMAL HIGH (ref 0–44)
AST: 44 U/L — ABNORMAL HIGH (ref 15–41)
Albumin: 4 g/dL (ref 3.5–5.0)
Alkaline Phosphatase: 86 U/L (ref 38–126)
Anion gap: 7 (ref 5–15)
BUN: 14 mg/dL (ref 6–20)
CO2: 22 mmol/L (ref 22–32)
Calcium: 8.9 mg/dL (ref 8.9–10.3)
Chloride: 107 mmol/L (ref 98–111)
Creatinine, Ser: 0.78 mg/dL (ref 0.61–1.24)
GFR, Estimated: >60 mL/min (ref >60)
Glucose, Bld: 117 mg/dL — ABNORMAL HIGH (ref 70–99)
Potassium: 4.1 mmol/L (ref 3.5–5.1)
Sodium: 136 mmol/L (ref 135–145)
Total Bilirubin: 0.6 mg/dL (ref 0.3–1.2)
Total Protein: 7.1 g/dL (ref 6.5–8.1)

## 2023-06-03 LAB — LIPID PANEL
Cholesterol: 273 mg/dL — ABNORMAL HIGH (ref 0–200)
HDL: 56 mg/dL (ref >40)
LDL Cholesterol: 194 mg/dL — ABNORMAL HIGH (ref 0–99)
Total CHOL/HDL Ratio: 4.9 RATIO
Triglycerides: 116 mg/dL (ref <150)
VLDL: 23 mg/dL (ref 0–40)

## 2023-06-03 LAB — HEMOGLOBIN A1C
Hgb A1c MFr Bld: 5.5 % (ref 4.8–5.6)
Mean Plasma Glucose: 111.15 mg/dL

## 2023-06-03 LAB — PSA: Prostatic Specific Antigen: 0.89 ng/mL (ref 0.00–4.00)

## 2023-06-03 MED ORDER — LOSARTAN POTASSIUM 50 MG PO TABS: 50.0000 mg | ORAL_TABLET | Freq: Every day | ORAL | 3 refills | Status: DC

## 2023-06-03 MED ORDER — ROSUVASTATIN CALCIUM 20 MG PO TABS: 20.0000 mg | ORAL_TABLET | Freq: Every day | ORAL | 3 refills | Status: DC

## 2023-06-03 NOTE — Progress Notes (Signed)
BP (!) 142/89   Pulse 97   Temp 97.8 F (36.6 C)   Wt 161 lb 8 oz (73.3 kg)   SpO2 94%   BMI 28.61 kg/m    Subjective:    Patient ID: Eric Warner, male    DOB: June 27, 1966, 57 y.o.   MRN: [id number]  HPI: Eric Warner is a 57 y.o. male presenting on 06/03/2023 for New Patient (Initial Visit) (Pt is here reestablishing care. Pt has been out of his medications for about a year.)   HPI   Chief Complaint  Patient presents with   New Patient (Initial Visit)    Pt is here reestablishing care. Pt has been out of his medications for about a year.    He was last seen here 01/10/2022.  He says he feels well and he has no complaints.    Relevant past medical, surgical, family and social history reviewed and updated as indicated. Interim medical history since our last visit reviewed. Allergies and medications reviewed and updated.  Review of Systems  Per HPI unless specifically indicated above     Objective:    BP (!) 142/89   Pulse 97   Temp 97.8 F (36.6 C)   Wt 161 lb 8 oz (73.3 kg)   SpO2 94%   BMI 28.61 kg/m   Wt Readings from Last 3 Encounters:  06/03/23 161 lb 8 oz (73.3 kg)  05/27/23 162 lb 6.4 oz (73.7 kg)  03/27/21 150 lb (68 kg)    Physical Exam Vitals reviewed.  Constitutional:      General: He is not in acute distress.    Appearance: He is well-developed. He is not ill-appearing or toxic-appearing.  HENT:     Head: Normocephalic and atraumatic.     Right Ear: Tympanic membrane, ear canal and external ear normal.     Left Ear: Tympanic membrane, ear canal and external ear normal.  Eyes:     Extraocular Movements: Extraocular movements intact.     Conjunctiva/sclera: Conjunctivae normal.     Pupils: Pupils are equal, round, and reactive to light.  Neck:     Thyroid: No thyromegaly.  Cardiovascular:     Rate and Rhythm: Normal rate and regular rhythm.     Heart sounds: Normal heart sounds.  Pulmonary:     Effort: Pulmonary effort is  normal.     Breath sounds: Normal breath sounds. No wheezing or rales.  Abdominal:     General: Bowel sounds are normal.     Palpations: Abdomen is soft. There is no hepatomegaly, splenomegaly, mass or pulsatile mass.     Tenderness: There is no abdominal tenderness. There is no guarding or rebound.  Musculoskeletal:     Cervical back: Neck supple.     Right lower leg: No edema.     Left lower leg: No edema.  Lymphadenopathy:     Cervical: No cervical adenopathy.  Skin:    General: Skin is warm and dry.     Findings: No rash.  Neurological:     Mental Status: He is alert and oriented to person, place, and time.     Motor: No weakness or tremor.     Coordination: Romberg sign negative.     Gait: Gait is intact.     Deep Tendon Reflexes:     Reflex Scores:      Patellar reflexes are 2+ on the right side and 2+ on the left side. Psychiatric:        Attention  and Perception: Attention normal.        Mood and Affect: Mood normal.        Speech: Speech normal.        Behavior: Behavior normal. Behavior is cooperative.     Comments: Pleasant and engaged           Assessment & Plan:    Encounter Diagnoses  Name Primary?   Encounter to establish care Yes   Primary hypertension    Hyperlipidemia, unspecified hyperlipidemia type    Screening for colon cancer    Screening for prostate cancer    Elevated LFTs    Screening for diabetes mellitus      -update labs -restart bp and chol meds -pt to follow up 1 month to recheck bp -pt is given FIT test for colon cancer screening

## 2023-06-30 LAB — POC FIT TEST STOOL: Fecal Occult Blood: NEGATIVE

## 2023-07-02 ENCOUNTER — Ambulatory Visit: Payer: Self-pay | Admitting: Physician Assistant

## 2023-07-02 ENCOUNTER — Encounter: Payer: Self-pay | Admitting: Physician Assistant

## 2023-07-02 VITALS — BP 117/72 | HR 70 | Temp 97.7°F | Wt 163.8 lb

## 2023-07-02 DIAGNOSIS — R7989 Other specified abnormal findings of blood chemistry: Secondary | ICD-10-CM

## 2023-07-02 DIAGNOSIS — I10 Essential (primary) hypertension: Secondary | ICD-10-CM

## 2023-07-02 DIAGNOSIS — E785 Hyperlipidemia, unspecified: Secondary | ICD-10-CM

## 2023-07-02 NOTE — Progress Notes (Signed)
BP 117/72   Pulse 70   Temp 97.7 F (36.5 C)   SpO2 96%    Subjective:    Patient ID: Eric Warner, male    DOB: Jan 11, 1966, 57 y.o.   MRN: [id number]  HPI: Eric Warner is a 57 y.o. male presenting on 07/02/2023 for Hypertension   HPI  Chief Complaint  Patient presents with   Hypertension    Pt is doing well today and has no complaints  Relevant past medical, surgical, family and social history reviewed and updated as indicated. Interim medical history since our last visit reviewed. Allergies and medications reviewed and updated.   Current Outpatient Medications:    losartan (COZAAR) 50 MG tablet, Take 1 tablet (50 mg total) by mouth daily., Disp: 30 tablet, Rfl: 3   rosuvastatin (CRESTOR) 20 MG tablet, Take 1 tablet (20 mg total) by mouth daily., Disp: 30 tablet, Rfl: 3   Review of Systems  Per HPI unless specifically indicated above     Objective:    BP 117/72   Pulse 70   Temp 97.7 F (36.5 C)   SpO2 96%   Wt Readings from Last 3 Encounters:  06/03/23 161 lb 8 oz (73.3 kg)  05/27/23 162 lb 6.4 oz (73.7 kg)  03/27/21 150 lb (68 kg)    Physical Exam Vitals reviewed.  Constitutional:      General: He is not in acute distress.    Appearance: He is well-developed. He is not ill-appearing.  HENT:     Head: Normocephalic and atraumatic.  Cardiovascular:     Rate and Rhythm: Normal rate and regular rhythm.  Pulmonary:     Effort: Pulmonary effort is normal.     Breath sounds: Normal breath sounds. No wheezing.  Abdominal:     General: Bowel sounds are normal.     Palpations: Abdomen is soft.     Tenderness: There is no abdominal tenderness.  Musculoskeletal:     Cervical back: Neck supple.     Right lower leg: No edema.     Left lower leg: No edema.  Lymphadenopathy:     Cervical: No cervical adenopathy.  Skin:    General: Skin is warm and dry.  Neurological:     Mental Status: He is alert and oriented to person, place, and time.   Psychiatric:        Behavior: Behavior normal.     Results for orders placed or performed during the hospital encounter of 06/03/23  Hemoglobin A1c  Result Value Ref Range   Hgb A1c MFr Bld 5.5 4.8 - 5.6 %   Mean Plasma Glucose 111.15 mg/dL  PSA  Result Value Ref Range   Prostatic Specific Antigen 0.89 0.00 - 4.00 ng/mL  Comprehensive metabolic panel  Result Value Ref Range   Sodium 136 135 - 145 mmol/L   Potassium 4.1 3.5 - 5.1 mmol/L   Chloride 107 98 - 111 mmol/L   CO2 22 22 - 32 mmol/L   Glucose, Bld 117 (H) 70 - 99 mg/dL   BUN 14 6 - 20 mg/dL   Creatinine, Ser 7.82 0.61 - 1.24 mg/dL   Calcium 8.9 8.9 - 95.6 mg/dL   Total Protein 7.1 6.5 - 8.1 g/dL   Albumin 4.0 3.5 - 5.0 g/dL   AST 44 (H) 15 - 41 U/L   ALT 79 (H) 0 - 44 U/L   Alkaline Phosphatase 86 38 - 126 U/L   Total Bilirubin 0.6 0.3 - 1.2 mg/dL  GFR, Estimated >60 >60 mL/min   Anion gap 7 5 - 15  Lipid panel  Result Value Ref Range   Cholesterol 273 (H) 0 - 200 mg/dL   Triglycerides 846 <962 mg/dL   HDL 56 >95 mg/dL   Total CHOL/HDL Ratio 4.9 RATIO   VLDL 23 0 - 40 mg/dL   LDL Cholesterol 284 (H) 0 - 99 mg/dL      Assessment & Plan:    Encounter Diagnoses  Name Primary?   Primary hypertension Yes   Hyperlipidemia, unspecified hyperlipidemia type    Elevated LFTs      -bp improved on losartan -reviewed labs with pt -pt to continue losartan and crestor -he will f/u 3 mo.  He is to contact office sooner prn

## 2023-09-11 ENCOUNTER — Other Ambulatory Visit: Payer: Self-pay | Admitting: Physician Assistant

## 2023-09-11 DIAGNOSIS — R7989 Other specified abnormal findings of blood chemistry: Secondary | ICD-10-CM

## 2023-09-11 DIAGNOSIS — E785 Hyperlipidemia, unspecified: Secondary | ICD-10-CM

## 2023-09-11 DIAGNOSIS — I10 Essential (primary) hypertension: Secondary | ICD-10-CM

## 2023-09-26 ENCOUNTER — Other Ambulatory Visit (HOSPITAL_COMMUNITY)
Admission: RE | Admit: 2023-09-26 | Discharge: 2023-09-26 | Disposition: A | Discharge status: Home / Self Care | Payer: Self-pay | Source: Ambulatory Visit | Attending: Physician Assistant | Admitting: Physician Assistant

## 2023-09-26 DIAGNOSIS — R7989 Other specified abnormal findings of blood chemistry: Secondary | ICD-10-CM | POA: Insufficient documentation

## 2023-09-26 DIAGNOSIS — E785 Hyperlipidemia, unspecified: Secondary | ICD-10-CM | POA: Insufficient documentation

## 2023-09-26 DIAGNOSIS — I10 Essential (primary) hypertension: Secondary | ICD-10-CM | POA: Insufficient documentation

## 2023-09-26 LAB — COMPREHENSIVE METABOLIC PANEL
ALT: 44 U/L (ref 0–44)
AST: 27 U/L (ref 15–41)
Albumin: 3.9 g/dL (ref 3.5–5.0)
Alkaline Phosphatase: 82 U/L (ref 38–126)
Anion gap: 6 (ref 5–15)
BUN: 15 mg/dL (ref 6–20)
CO2: 22 mmol/L (ref 22–32)
Calcium: 9 mg/dL (ref 8.9–10.3)
Chloride: 108 mmol/L (ref 98–111)
Creatinine, Ser: 0.88 mg/dL (ref 0.61–1.24)
GFR, Estimated: >60 mL/min (ref >60)
Glucose, Bld: 120 mg/dL — ABNORMAL HIGH (ref 70–99)
Potassium: 4.3 mmol/L (ref 3.5–5.1)
Sodium: 136 mmol/L (ref 135–145)
Total Bilirubin: 0.9 mg/dL (ref 0.0–1.2)
Total Protein: 7.1 g/dL (ref 6.5–8.1)

## 2023-09-26 LAB — LIPID PANEL
Cholesterol: 179 mg/dL (ref 0–200)
HDL: 64 mg/dL (ref >40)
LDL Cholesterol: 102 mg/dL — ABNORMAL HIGH (ref 0–99)
Total CHOL/HDL Ratio: 2.8 {ratio}
Triglycerides: 63 mg/dL (ref <150)
VLDL: 13 mg/dL (ref 0–40)

## 2023-10-01 ENCOUNTER — Ambulatory Visit: Payer: Self-pay | Admitting: Physician Assistant

## 2023-10-01 ENCOUNTER — Encounter: Payer: Self-pay | Admitting: Physician Assistant

## 2023-10-01 VITALS — BP 130/82 | HR 72 | Temp 96.7°F | Wt 163.0 lb

## 2023-10-01 DIAGNOSIS — K0889 Other specified disorders of teeth and supporting structures: Secondary | ICD-10-CM

## 2023-10-01 DIAGNOSIS — I10 Essential (primary) hypertension: Secondary | ICD-10-CM

## 2023-10-01 DIAGNOSIS — E785 Hyperlipidemia, unspecified: Secondary | ICD-10-CM

## 2023-10-01 DIAGNOSIS — K029 Dental caries, unspecified: Secondary | ICD-10-CM

## 2023-10-01 MED ORDER — ROSUVASTATIN CALCIUM 20 MG PO TABS: 20.0000 mg | ORAL_TABLET | Freq: Every day | ORAL | 3 refills | Status: DC

## 2023-10-01 MED ORDER — AMOXICILLIN 500 MG PO CAPS: 500.0000 mg | ORAL_CAPSULE | Freq: Three times a day (TID) | ORAL | 0 refills | Status: AC

## 2023-10-01 MED ORDER — LOSARTAN POTASSIUM 50 MG PO TABS: 50.0000 mg | ORAL_TABLET | Freq: Every day | ORAL | 3 refills | Status: DC

## 2023-10-01 NOTE — Progress Notes (Signed)
BP 130/82   Pulse 72   Temp (!) 96.7 F (35.9 C)   Wt 163 lb (73.9 kg)   SpO2 98%   BMI 28.87 kg/m    Subjective:    Patient ID: Eric Warner, male    DOB: 12-10-1965, 58 y.o.   MRN: [id number]  HPI: Eric Warner is a 58 y.o. male presenting on 10/01/2023 for Hypertension, Hyperlipidemia, and Dental Pain (Pt has dental pain R side for about one week. Pt has sensitivity with hot and cold beverages.)   HPI   Chief Complaint  Patient presents with   Hypertension   Hyperlipidemia   Dental Pain    Pt has dental pain R side for about one week. Pt has sensitivity with hot and cold beverages.    Pt has had no swelling of the face and has no fevers.  He is doing well other than the tooth.     Relevant past medical, surgical, family and social history reviewed and updated as indicated. Interim medical history since our last visit reviewed. Allergies and medications reviewed and updated.   Current Outpatient Medications:    losartan (COZAAR) 50 MG tablet, Take 1 tablet (50 mg total) by mouth daily., Disp: 30 tablet, Rfl: 3   rosuvastatin (CRESTOR) 20 MG tablet, Take 1 tablet (20 mg total) by mouth daily., Disp: 30 tablet, Rfl: 3   Review of Systems  Per HPI unless specifically indicated above     Objective:    BP 130/82   Pulse 72   Temp (!) 96.7 F (35.9 C)   Wt 163 lb (73.9 kg)   SpO2 98%   BMI 28.87 kg/m   Wt Readings from Last 3 Encounters:  10/01/23 163 lb (73.9 kg)  07/02/23 163 lb 12 oz (74.3 kg)  06/03/23 161 lb 8 oz (73.3 kg)    Physical Exam Vitals reviewed.  Constitutional:      General: He is not in acute distress.    Appearance: He is well-developed. He is not ill-appearing or toxic-appearing.  HENT:     Head: Normocephalic and atraumatic.     Mouth/Throat:     Mouth: Mucous membranes are moist.     Dentition: Abnormal dentition. Dental tenderness and dental caries present. No dental abscesses.     Comments: Pt has partial  plate on top.  Right top tooth (?premolar) with decayed hole near gumline which is very tender.  There is no abscess.  No swelling of the face.  Cardiovascular:     Rate and Rhythm: Normal rate and regular rhythm.  Pulmonary:     Effort: Pulmonary effort is normal.     Breath sounds: Normal breath sounds. No wheezing.  Abdominal:     General: Bowel sounds are normal.     Palpations: Abdomen is soft.     Tenderness: There is no abdominal tenderness.  Musculoskeletal:     Cervical back: Neck supple.     Right lower leg: No edema.     Left lower leg: No edema.  Lymphadenopathy:     Cervical: No cervical adenopathy.  Skin:    General: Skin is warm and dry.  Neurological:     Mental Status: He is alert and oriented to person, place, and time.  Psychiatric:        Behavior: Behavior normal.     Results for orders placed or performed during the hospital encounter of 09/26/23  Lipid panel   Collection Time: 09/26/23  8:02 AM  Result Value  Ref Range   Cholesterol 179 0 - 200 mg/dL   Triglycerides 63 <784 mg/dL   HDL 64 >69 mg/dL   Total CHOL/HDL Ratio 2.8 RATIO   VLDL 13 0 - 40 mg/dL   LDL Cholesterol 629 (H) 0 - 99 mg/dL  Comprehensive metabolic panel   Collection Time: 09/26/23  8:02 AM  Result Value Ref Range   Sodium 136 135 - 145 mmol/L   Potassium 4.3 3.5 - 5.1 mmol/L   Chloride 108 98 - 111 mmol/L   CO2 22 22 - 32 mmol/L   Glucose, Bld 120 (H) 70 - 99 mg/dL   BUN 15 6 - 20 mg/dL   Creatinine, Ser 5.28 0.61 - 1.24 mg/dL   Calcium 9.0 8.9 - 41.3 mg/dL   Total Protein 7.1 6.5 - 8.1 g/dL   Albumin 3.9 3.5 - 5.0 g/dL   AST 27 15 - 41 U/L   ALT 44 0 - 44 U/L   Alkaline Phosphatase 82 38 - 126 U/L   Total Bilirubin 0.9 0.0 - 1.2 mg/dL   GFR, Estimated >24 >40 mL/min   Anion gap 6 5 - 15      Assessment & Plan:    Encounter Diagnoses  Name Primary?   Primary hypertension Yes   Hyperlipidemia, unspecified hyperlipidemia type    Dentalgia    Dental decay        -reviewed labs with pt -pt to continue current medications -rx amoxil for the tooth and dental referral -pt to follow up 4 months.  He is to contact office sooner prn

## 2023-12-22 ENCOUNTER — Other Ambulatory Visit: Payer: Self-pay | Admitting: Physician Assistant

## 2023-12-22 MED ORDER — LOSARTAN POTASSIUM 50 MG PO TABS: 50.0000 mg | ORAL_TABLET | Freq: Every day | ORAL | 3 refills | Status: DC

## 2023-12-31 ENCOUNTER — Telehealth: Payer: Self-pay

## 2024-01-15 ENCOUNTER — Other Ambulatory Visit: Payer: Self-pay | Admitting: Physician Assistant

## 2024-01-15 DIAGNOSIS — I10 Essential (primary) hypertension: Secondary | ICD-10-CM

## 2024-01-15 DIAGNOSIS — E785 Hyperlipidemia, unspecified: Secondary | ICD-10-CM

## 2024-01-28 ENCOUNTER — Ambulatory Visit: Payer: Self-pay | Admitting: Physician Assistant

## 2024-02-11 ENCOUNTER — Ambulatory Visit: Payer: Self-pay | Admitting: Physician Assistant

## 2024-02-16 ENCOUNTER — Other Ambulatory Visit (HOSPITAL_COMMUNITY)
Admission: RE | Admit: 2024-02-16 | Discharge: 2024-02-16 | Disposition: A | Discharge status: Home / Self Care | Payer: Self-pay | Source: Ambulatory Visit | Attending: Physician Assistant | Admitting: Physician Assistant

## 2024-02-16 DIAGNOSIS — E785 Hyperlipidemia, unspecified: Secondary | ICD-10-CM | POA: Insufficient documentation

## 2024-02-16 DIAGNOSIS — I10 Essential (primary) hypertension: Secondary | ICD-10-CM | POA: Insufficient documentation

## 2024-02-16 LAB — COMPREHENSIVE METABOLIC PANEL WITH GFR
ALT: 56 U/L — ABNORMAL HIGH (ref 0–44)
AST: 38 U/L (ref 15–41)
Albumin: 3.8 g/dL (ref 3.5–5.0)
Alkaline Phosphatase: 76 U/L (ref 38–126)
Anion gap: 8 (ref 5–15)
BUN: 14 mg/dL (ref 6–20)
CO2: 20 mmol/L — ABNORMAL LOW (ref 22–32)
Calcium: 8.6 mg/dL — ABNORMAL LOW (ref 8.9–10.3)
Chloride: 106 mmol/L (ref 98–111)
Creatinine, Ser: 0.87 mg/dL (ref 0.61–1.24)
GFR, Estimated: >60 mL/min (ref >60)
Glucose, Bld: 101 mg/dL — ABNORMAL HIGH (ref 70–99)
Potassium: 4 mmol/L (ref 3.5–5.1)
Sodium: 134 mmol/L — ABNORMAL LOW (ref 135–145)
Total Bilirubin: 0.9 mg/dL (ref 0.0–1.2)
Total Protein: 7.1 g/dL (ref 6.5–8.1)

## 2024-02-16 LAB — LIPID PANEL
Cholesterol: 194 mg/dL (ref 0–200)
HDL: 49 mg/dL (ref >40)
LDL Cholesterol: 116 mg/dL — ABNORMAL HIGH (ref 0–99)
Total CHOL/HDL Ratio: 4 ratio
Triglycerides: 145 mg/dL (ref <150)
VLDL: 29 mg/dL (ref 0–40)

## 2024-02-18 ENCOUNTER — Ambulatory Visit: Payer: Self-pay | Admitting: Physician Assistant

## 2024-02-18 ENCOUNTER — Encounter: Payer: Self-pay | Admitting: Physician Assistant

## 2024-02-18 VITALS — BP 120/80 | HR 72 | Temp 97.9°F | Wt 159.2 lb

## 2024-02-18 DIAGNOSIS — I10 Essential (primary) hypertension: Secondary | ICD-10-CM

## 2024-02-18 DIAGNOSIS — E785 Hyperlipidemia, unspecified: Secondary | ICD-10-CM

## 2024-02-18 MED ORDER — ROSUVASTATIN CALCIUM 20 MG PO TABS: 20.0000 mg | ORAL_TABLET | Freq: Every day | ORAL | 6 refills | Status: AC

## 2024-02-18 MED ORDER — LOSARTAN POTASSIUM 50 MG PO TABS: 50.0000 mg | ORAL_TABLET | Freq: Every day | ORAL | 6 refills | Status: AC

## 2024-02-18 NOTE — Progress Notes (Signed)
 BP 120/80   Pulse 72   Temp 97.9 F (36.6 C)   Wt 159 lb 4 oz (72.2 kg)   SpO2 98%   BMI 28.21 kg/m    Subjective:    Patient ID: Eric Warner, male    DOB: Apr 17, 1966, 58 y.o.   MRN: [id number]  HPI: Eric Warner is a 58 y.o. male presenting on 02/18/2024 for Hypertension, Hyperlipidemia, and Other (Pt mentions he was sick for 3 days last week. Wed - Friday. Pt states he had diarrhea, fever, dizziness and body aches. Pt states his symptoms are improved and he has been well since Monday. )   HPI  Chief Complaint  Patient presents with   Hypertension   Hyperlipidemia   Other    Pt mentions he was sick for 3 days last week. Wed - Friday. Pt states he had diarrhea, fever, dizziness and body aches. Pt states his symptoms are improved and he has been well since Monday.     Pt says he feels well today and has no complaints.     Relevant past medical, surgical, family and social history reviewed and updated as indicated. Interim medical history since our last visit reviewed. Allergies and medications reviewed and updated.   Current Outpatient Medications:    losartan  (COZAAR ) 50 MG tablet, Take 1 tablet (50 mg total) by mouth daily., Disp: 30 tablet, Rfl: 3   rosuvastatin  (CRESTOR ) 20 MG tablet, Take 1 tablet (20 mg total) by mouth daily., Disp: 30 tablet, Rfl: 3    Review of Systems  Per HPI unless specifically indicated above     Objective:     BP 120/80   Pulse 72   Temp 97.9 F (36.6 C)   Wt 159 lb 4 oz (72.2 kg)   SpO2 98%   BMI 28.21 kg/m   Wt Readings from Last 3 Encounters:  02/18/24 159 lb 4 oz (72.2 kg)  10/01/23 163 lb (73.9 kg)  07/02/23 163 lb 12 oz (74.3 kg)    Physical Exam Vitals reviewed.  Constitutional:      General: He is not in acute distress.    Appearance: Normal appearance. He is well-developed. He is not ill-appearing or toxic-appearing.  HENT:     Head: Normocephalic and atraumatic.  Cardiovascular:     Rate and  Rhythm: Normal rate and regular rhythm.  Pulmonary:     Effort: Pulmonary effort is normal.     Breath sounds: Normal breath sounds. No wheezing.  Abdominal:     General: Bowel sounds are normal.     Palpations: Abdomen is soft. There is no mass.     Tenderness: There is no abdominal tenderness. There is no guarding or rebound.  Musculoskeletal:     Cervical back: Neck supple.     Right lower leg: No edema.     Left lower leg: No edema.  Lymphadenopathy:     Cervical: No cervical adenopathy.  Skin:    General: Skin is warm and dry.  Neurological:     Mental Status: He is alert and oriented to person, place, and time.     Gait: Gait is intact. Gait normal.  Psychiatric:        Attention and Perception: Attention normal.        Speech: Speech normal.        Behavior: Behavior normal. Behavior is cooperative.     Results for orders placed or performed during the hospital encounter of 02/16/24  Lipid panel  Collection Time: 02/16/24  7:27 AM  Result Value Ref Range   Cholesterol 194 0 - 200 mg/dL   Triglycerides 829 <562 mg/dL   HDL 49 >13 mg/dL   Total CHOL/HDL Ratio 4.0 RATIO   VLDL 29 0 - 40 mg/dL   LDL Cholesterol 086 (H) 0 - 99 mg/dL  Comprehensive metabolic panel with GFR   Collection Time: 02/16/24  7:27 AM  Result Value Ref Range   Sodium 134 (L) 135 - 145 mmol/L   Potassium 4.0 3.5 - 5.1 mmol/L   Chloride 106 98 - 111 mmol/L   CO2 20 (L) 22 - 32 mmol/L   Glucose, Bld 101 (H) 70 - 99 mg/dL   BUN 14 6 - 20 mg/dL   Creatinine, Ser 5.78 0.61 - 1.24 mg/dL   Calcium  8.6 (L) 8.9 - 10.3 mg/dL   Total Protein 7.1 6.5 - 8.1 g/dL   Albumin 3.8 3.5 - 5.0 g/dL   AST 38 15 - 41 U/L   ALT 56 (H) 0 - 44 U/L   Alkaline Phosphatase 76 38 - 126 U/L   Total Bilirubin 0.9 0.0 - 1.2 mg/dL   GFR, Estimated >46 >96 mL/min   Anion gap 8 5 - 15      Assessment & Plan:    Encounter Diagnoses  Name Primary?   Primary hypertension Yes   Hyperlipidemia, unspecified  hyperlipidemia type      -reviewed labs with pt -pt to continue losartan  and rosuvastatin  -pt to follow up in four months. He is to contact office sooner prn

## 2024-06-07 ENCOUNTER — Other Ambulatory Visit: Payer: Self-pay | Admitting: Physician Assistant

## 2024-06-07 DIAGNOSIS — Z125 Encounter for screening for malignant neoplasm of prostate: Secondary | ICD-10-CM

## 2024-06-07 DIAGNOSIS — E785 Hyperlipidemia, unspecified: Secondary | ICD-10-CM

## 2024-06-07 DIAGNOSIS — I10 Essential (primary) hypertension: Secondary | ICD-10-CM

## 2024-06-07 DIAGNOSIS — R7989 Other specified abnormal findings of blood chemistry: Secondary | ICD-10-CM

## 2024-06-22 ENCOUNTER — Ambulatory Visit: Payer: Self-pay | Admitting: Physician Assistant

## 2024-10-27 ENCOUNTER — Ambulatory Visit: Payer: Self-pay | Admitting: Physician Assistant
